# Patient Record
Sex: Male | Born: 1950
Health system: Southern US, Community
[De-identification: ages and names within clinical notes are randomized; demographics above are authoritative.]

## PROBLEM LIST (undated history)

## (undated) ENCOUNTER — Emergency Department (HOSPITAL_COMMUNITY): Admission: EM | Payer: Self-pay | Source: Home / Self Care

## (undated) DIAGNOSIS — K219 Gastro-esophageal reflux disease without esophagitis: Secondary | ICD-10-CM

## (undated) DIAGNOSIS — R7303 Prediabetes: Secondary | ICD-10-CM

## (undated) HISTORY — PX: NASAL RECONSTRUCTION WITH SEPTAL REPAIR: SHX5665

## (undated) HISTORY — PX: POLYPECTOMY: SHX149

## (undated) HISTORY — DX: Prediabetes: R73.03

## (undated) HISTORY — PX: ESOPHAGOGASTRODUODENOSCOPY ENDOSCOPY: SHX5814

## (undated) HISTORY — PX: COLONOSCOPY: SHX174

## (undated) HISTORY — DX: Gastro-esophageal reflux disease without esophagitis: K21.9

---

## 2008-11-02 ENCOUNTER — Encounter: Admission: RE | Admit: 2008-11-02 | Discharge: 2008-11-02 | Payer: Self-pay | Admitting: Specialist

## 2011-06-22 ENCOUNTER — Ambulatory Visit (HOSPITAL_BASED_OUTPATIENT_CLINIC_OR_DEPARTMENT_OTHER)
Admission: RE | Admit: 2011-06-22 | Discharge: 2011-06-22 | Disposition: A | Discharge status: Home / Self Care | Payer: Self-pay | Source: Ambulatory Visit | Attending: Internal Medicine | Admitting: Internal Medicine

## 2011-06-22 ENCOUNTER — Encounter: Payer: Self-pay | Admitting: Internal Medicine

## 2011-06-22 ENCOUNTER — Ambulatory Visit (INDEPENDENT_AMBULATORY_CARE_PROVIDER_SITE_OTHER): Payer: Self-pay | Admitting: Internal Medicine

## 2011-06-22 VITALS — BP 110/82 | HR 80 | Temp 97.9°F | Resp 18 | Ht 68.5 in | Wt 163.0 lb

## 2011-06-22 DIAGNOSIS — R0789 Other chest pain: Secondary | ICD-10-CM | POA: Insufficient documentation

## 2011-06-22 DIAGNOSIS — R079 Chest pain, unspecified: Secondary | ICD-10-CM | POA: Insufficient documentation

## 2011-06-22 DIAGNOSIS — F17200 Nicotine dependence, unspecified, uncomplicated: Secondary | ICD-10-CM | POA: Insufficient documentation

## 2011-06-22 NOTE — Assessment & Plan Note (Signed)
Obtain cxr pa/lat. Pain resolved. Schedule f/u in 6wks or sooner if needed. Obtain record release from ED as pt believes labs also done. At time of f/u discuss preventive care measures as well.
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
## 2011-07-31 ENCOUNTER — Telehealth: Payer: Self-pay | Admitting: *Deleted

## 2011-07-31 ENCOUNTER — Ambulatory Visit: Payer: Self-pay | Admitting: Internal Medicine

## 2011-07-31 NOTE — Telephone Encounter (Signed)
Marj, Please call pt to reschedule appt.    Office Message [address] [place], [place] [address] p. [phone] f. [phone] To: Lorrin Mais Fax: [phone] From: [facility] Date/ Time: 07/28/2011 5:46 PM Taken By: Delrae Rend, CSR Caller: Naeem Facility: not collected Patient: Marcus Massey, Marcus Massey DOB: [birth year] Phone: [phone] Reason for Call: See info below Regarding Appointment: Yes Appt Date: 07/31/2011 Appt Time: 1:45:00 PM Provider: Marguarite Arbour Reason: Details: Unable to make appointment time, will call back to reschdule. Outcome: Instructed patient to call back on the next business day.

## 2015-01-24 ENCOUNTER — Emergency Department (HOSPITAL_COMMUNITY)
Admission: EM | Admit: 2015-01-24 | Discharge: 2015-01-24 | Disposition: A | Discharge status: Home / Self Care | Payer: Self-pay | Attending: Emergency Medicine | Admitting: Emergency Medicine

## 2015-01-24 ENCOUNTER — Emergency Department (HOSPITAL_COMMUNITY): Payer: Self-pay

## 2015-01-24 ENCOUNTER — Encounter (HOSPITAL_COMMUNITY): Payer: Self-pay | Admitting: Emergency Medicine

## 2015-01-24 DIAGNOSIS — R0789 Other chest pain: Secondary | ICD-10-CM | POA: Insufficient documentation

## 2015-01-24 DIAGNOSIS — Z720 Tobacco use: Secondary | ICD-10-CM | POA: Insufficient documentation

## 2015-01-24 LAB — BASIC METABOLIC PANEL
ANION GAP: 10 (ref 5–15)
BUN: 14 mg/dL (ref 6–20)
CHLORIDE: 102 mmol/L (ref 101–111)
CO2: 25 mmol/L (ref 22–32)
CREATININE: 1.07 mg/dL (ref 0.61–1.24)
Calcium: 9.1 mg/dL (ref 8.9–10.3)
Glucose, Bld: 217 mg/dL — ABNORMAL HIGH (ref 65–99)
POTASSIUM: 3.4 mmol/L — AB (ref 3.5–5.1)
SODIUM: 137 mmol/L (ref 135–145)

## 2015-01-24 LAB — CBC
HCT: 46 % (ref 39.0–52.0)
Hemoglobin: 15.8 g/dL (ref 13.0–17.0)
MCH: 31.7 pg (ref 26.0–34.0)
MCHC: 34.3 g/dL (ref 30.0–36.0)
MCV: 92.4 fL (ref 78.0–100.0)
Platelets: 221 10*3/uL (ref 150–400)
RBC: 4.98 MIL/uL (ref 4.22–5.81)
RDW: 13.1 % (ref 11.5–15.5)
WBC: 7.9 10*3/uL (ref 4.0–10.5)

## 2015-01-24 LAB — I-STAT TROPONIN, ED: Troponin i, poc: 0.03 ng/mL (ref 0.00–0.08)

## 2015-01-24 MED ORDER — PREDNISONE 20 MG PO TABS
60.0000 mg | ORAL_TABLET | ORAL | Status: AC
  Administered 2015-01-24: 60 mg via ORAL
  Filled 2015-01-24: qty 3

## 2015-01-24 MED ORDER — PREDNISONE 20 MG PO TABS: 60.0000 mg | ORAL_TABLET | Freq: Every day | ORAL | Status: AC

## 2015-01-24 MED ORDER — SUCRALFATE 1 GM/10ML PO SUSP
1.0000 g | Freq: Three times a day (TID) | ORAL | Status: DC
  Administered 2015-01-24: 1 g via ORAL
  Filled 2015-01-24 (×2): qty 10

## 2015-01-24 MED ORDER — SUCRALFATE 1 GM/10ML PO SUSP: 1.0000 g | Freq: Three times a day (TID) | ORAL | Status: DC

## 2015-01-24 MED ORDER — KETOROLAC TROMETHAMINE 30 MG/ML IJ SOLN
30.0000 mg | Freq: Once | INTRAMUSCULAR | Status: AC
  Administered 2015-01-24: 30 mg via INTRAVENOUS
  Filled 2015-01-24: qty 1
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
## 2015-01-24 NOTE — ED Notes (Signed)
Pt. reports left chest pain onset last night with SOB , no cough or congestion , denies nausea or diaphoresis .

## 2015-01-24 NOTE — Discharge Instructions (Signed)
As discussed, today's evaluation has been largely reassuring. Your chest pain is likely due to inflammatory changes in the chest wall. However, with your history of smoking, it is important that you follow-up with our physicians for additional evaluation.  Return here for concerning changes in your condition.

## 2015-01-26 ENCOUNTER — Telehealth: Payer: Self-pay | Admitting: Internal Medicine

## 2015-01-26 NOTE — Telephone Encounter (Signed)
Experiencing bloating, indigestion, and stomach pain and discomfort and some days ago he has had shortness of breath. Patient is wondering if he needs to return to the ER or if he can obtain a sooner appt. Dr Jarold Song does not have anything sooner that the 29th. Please follow up with pt.

## 2015-02-03 ENCOUNTER — Ambulatory Visit: Payer: Self-pay | Attending: Family Medicine

## 2015-02-08 ENCOUNTER — Ambulatory Visit: Payer: Self-pay | Attending: Family Medicine | Admitting: Family Medicine

## 2015-02-08 ENCOUNTER — Encounter: Payer: Self-pay | Admitting: *Deleted

## 2015-02-08 ENCOUNTER — Encounter: Payer: Self-pay | Admitting: Family Medicine

## 2015-02-08 VITALS — BP 99/71 | HR 73 | Temp 98.2°F | Ht 68.5 in | Wt 162.0 lb

## 2015-02-08 DIAGNOSIS — R739 Hyperglycemia, unspecified: Secondary | ICD-10-CM

## 2015-02-08 DIAGNOSIS — R079 Chest pain, unspecified: Secondary | ICD-10-CM | POA: Insufficient documentation

## 2015-02-08 DIAGNOSIS — Z79899 Other long term (current) drug therapy: Secondary | ICD-10-CM | POA: Insufficient documentation

## 2015-02-08 DIAGNOSIS — R7309 Other abnormal glucose: Secondary | ICD-10-CM | POA: Insufficient documentation

## 2015-02-08 DIAGNOSIS — E876 Hypokalemia: Secondary | ICD-10-CM | POA: Insufficient documentation

## 2015-02-08 DIAGNOSIS — K2990 Gastroduodenitis, unspecified, without bleeding: Secondary | ICD-10-CM

## 2015-02-08 DIAGNOSIS — R1013 Epigastric pain: Secondary | ICD-10-CM | POA: Insufficient documentation

## 2015-02-08 DIAGNOSIS — R7303 Prediabetes: Secondary | ICD-10-CM | POA: Insufficient documentation

## 2015-02-08 DIAGNOSIS — K2970 Gastritis, unspecified, without bleeding: Secondary | ICD-10-CM | POA: Insufficient documentation

## 2015-02-08 LAB — POCT GLYCOSYLATED HEMOGLOBIN (HGB A1C): HEMOGLOBIN A1C: 6.3

## 2015-02-08 LAB — BASIC METABOLIC PANEL
BUN: 22 mg/dL (ref 7–25)
CO2: 25 mmol/L (ref 20–31)
Calcium: 8.8 mg/dL (ref 8.6–10.3)
Chloride: 102 mmol/L (ref 98–110)
Creat: 0.88 mg/dL (ref 0.70–1.25)
GLUCOSE: 101 mg/dL — AB (ref 65–99)
POTASSIUM: 4.5 mmol/L (ref 3.5–5.3)
SODIUM: 141 mmol/L (ref 135–146)

## 2015-02-08 MED ORDER — SUCRALFATE 1 G PO TABS
1.0000 g | ORAL_TABLET | Freq: Three times a day (TID) | ORAL | Status: DC
Start: 2015-02-08 — End: 2015-04-21

## 2015-02-08 MED ORDER — PANTOPRAZOLE SODIUM 40 MG PO TBEC: 40.0000 mg | DELAYED_RELEASE_TABLET | Freq: Every day | ORAL | Status: DC

## 2015-02-08 MED ORDER — SUCRALFATE 1 GM/10ML PO SUSP: 1.0000 g | Freq: Three times a day (TID) | ORAL | Status: DC

## 2015-02-08 NOTE — Progress Notes (Signed)
Patient reports episodes of severe heartburn that have been happening more frequently He was given carafate in the ED and has finished that He states he notices it after he eats or even several hours after he eats

## 2015-02-08 NOTE — Telephone Encounter (Signed)
Error

## 2015-02-08 NOTE — Progress Notes (Signed)
Marcus Massey, is a 64 y.o. male  YKZ:[id number]  LTJ:[id number]  DOB - January 30, 1951  CC:  Chief Complaint  Patient presents with  . Follow-up       HPI: Marcus Massey is a 64 y.o. male was seen at the [facility] ED on 01/24/15 after he had presented with chest pain. He was ruled out for ACS with negative troponins, EKG revealed normal sinus rhythm, he had a CXR which was negative for acute cardiopulmonary process but revealed emphysema. He had labs drawn which were unremarkable except for mild hypokalemia of 3.4 and elevated glucose of 217. He was prescribed Carafate which he took with improvement in symptoms until he ran out.  Complains of epigastric pain usually after a meal which is 8/10 in intensity; denies late meals. Denies diarrhea, constipation, melena.  Patient has No headache, No chest pain- No Nausea, No new weakness tingling or numbness, No Cough - SOB.  No Known Allergies History reviewed. No pertinent past medical history. Current Outpatient Prescriptions on File Prior to Visit  Medication Sig Dispense Refill  . sucralfate (CARAFATE) 1 GM/10ML suspension Take 10 mLs (1 g total) by mouth 4 (four) times daily -  with meals and at bedtime. (Patient not taking: Reported on 02/08/2015) 420 mL 0   No current facility-administered medications on file prior to visit.   History reviewed. No pertinent family history. Social History   Social History  . Marital Status: Married    Spouse Name: N/A  . Number of Children: N/A  . Years of Education: N/A   Occupational History  . Not on file.   Social History Main Topics  . Smoking status: Former Smoker -- 0.00 packs/day    Quit date: 01/25/2015  . Smokeless tobacco: Never Used  . Alcohol Use: No  . Drug Use: No  . Sexual Activity: Not on file   Other Topics Concern  . Not on file   Social History Narrative    Review of Systems: Constitutional: Negative for fever, chills, diaphoresis, activity change, appetite change and  fatigue. HENT: Negative for ear pain, nosebleeds, congestion, facial swelling, rhinorrhea, neck pain, neck stiffness and ear discharge.  Eyes: Negative for pain, discharge, redness, itching and visual disturbance. Respiratory: Negative for cough, choking, chest tightness, shortness of breath, wheezing and stridor.  Cardiovascular: Negative for chest pain, palpitations and leg swelling. Gastrointestinal: Negative for abdominal distention, positive for abdominal pain. Genitourinary: Negative for dysuria, urgency, frequency, hematuria, flank pain, decreased urine volume, difficulty urinating and dyspareunia.  Musculoskeletal: Negative for back pain, joint swelling, arthralgia and gait problem. Neurological: Negative for dizziness, tremors, seizures, syncope, facial asymmetry, speech difficulty, weakness, light-headedness, numbness and headaches.  Hematological: Negative for adenopathy. Does not bruise/bleed easily. Skin: Negative for rash, ulcer. Psychiatric/Behavioral: Negative for hallucinations, behavioral problems, confusion, dysphoric mood, decreased concentration and agitation.    Objective:   Filed Vitals:   02/08/15 1513  BP: 99/71  Pulse: 73  Temp: 98.2 F (36.8 C)    Physical Exam: Constitutional: Patient appears well-developed and well-nourished. No distress. HENT: Normocephalic, atraumatic, External right and left ear normal. Oropharynx is clear and moist.  Eyes: Conjunctivae and EOM are normal. PERRLA, no scleral icterus. Neck: Normal ROM, No JVD. No tracheal deviation. No thyromegaly. CVS: RRR, S1/S2 +, no murmurs, no gallops, no carotid bruit.  Pulmonary: Effort and breath sounds normal, no stridor, rhonchi, wheezes, rales.  Abdominal: Soft. BS +, no distension, + epigastric tenderness, no rebound or guarding.  Musculoskeletal: Normal range of motion.  No edema and no tenderness.  Lymphadenopathy: No lymphadenopathy noted, cervical, inguinal or axillary Neuro: Alert.  Normal reflexes, muscle tone coordination. No cranial nerve deficit. Skin: Skin is warm and dry. No rash noted. Not diaphoretic. No erythema. No pallor. Psychiatric: Normal mood and affect. Behavior, judgment, thought content normal.   Lab Results  Component Value Date   WBC 7.9 01/24/2015   HGB 15.8 01/24/2015   HCT 46.0 01/24/2015   MCV 92.4 01/24/2015   PLT 221 01/24/2015   Lab Results  Component Value Date   CREATININE 1.07 01/24/2015   BUN 14 01/24/2015   NA 137 01/24/2015   K 3.4* 01/24/2015   CL 102 01/24/2015   CO2 25 01/24/2015    Lab Results  Component Value Date   HGBA1C 6.30 02/08/2015       Assessment and plan:  Gastritis: Placed on Protonix and Carafate has been refilled. Will reassess for improvement at next visit and determine need for EGD.  Pre DM: Hba1c of 6.3 Will need to be educated on ADA diet and lifestyle modification.  Hypokalemia: Had a potassium of 3.4 Repeat today.   The patient was given clear instructions to go to ER or return to medical center if symptoms don't improve, worsen or new problems develop. The patient verbalized understanding. The patient was told to call to get lab results if they haven't heard anything in the next week.     Marcus Massey, [facility] [phone] 02/08/2015, 3:36 PM

## 2015-02-08 NOTE — Patient Instructions (Signed)

## 2015-02-09 ENCOUNTER — Telehealth: Payer: Self-pay | Admitting: *Deleted

## 2015-02-09 NOTE — Telephone Encounter (Signed)
Verified name and date of birth and  informed the patient that labs are normal. Thank you.

## 2015-02-09 NOTE — Telephone Encounter (Signed)
-----   Message from Arnoldo Morale, MD sent at 02/09/2015  8:13 AM EDT ----- Please inform the patient that labs are normal. Thank you.

## 2015-03-10 ENCOUNTER — Encounter: Payer: Self-pay | Admitting: *Deleted

## 2015-03-10 ENCOUNTER — Ambulatory Visit: Payer: Self-pay | Attending: Family Medicine | Admitting: Family Medicine

## 2015-03-10 ENCOUNTER — Encounter: Payer: Self-pay | Admitting: Family Medicine

## 2015-03-10 VITALS — BP 111/81 | HR 67 | Temp 98.1°F | Ht 68.5 in | Wt 159.6 lb

## 2015-03-10 DIAGNOSIS — K2970 Gastritis, unspecified, without bleeding: Secondary | ICD-10-CM | POA: Insufficient documentation

## 2015-03-10 DIAGNOSIS — K219 Gastro-esophageal reflux disease without esophagitis: Secondary | ICD-10-CM | POA: Insufficient documentation

## 2015-03-10 DIAGNOSIS — R7303 Prediabetes: Secondary | ICD-10-CM

## 2015-03-10 DIAGNOSIS — R7309 Other abnormal glucose: Secondary | ICD-10-CM | POA: Insufficient documentation

## 2015-03-10 DIAGNOSIS — K2990 Gastroduodenitis, unspecified, without bleeding: Secondary | ICD-10-CM

## 2015-03-10 NOTE — Telephone Encounter (Signed)
Error

## 2015-03-10 NOTE — Progress Notes (Signed)
Patient reports no pain today and that his indigestion is better now that he is taking his medications He states when he overeats he still experiences indigestion He is not wanting the flu shot because he is afraid of the cost of it and that he can get it for free at his church.

## 2015-03-10 NOTE — Progress Notes (Signed)
CC:  HPI: Marcus Massey is a 64 y.o. male with a history of prediabetes (A1c 6.3), gastritis and GERD currently on Protonix and Carafate and reports improvement in symptoms since he started the Protonix. However he complains of having to eat half the amount he usually ate due to early satiety; whenever he eats beef he states he feels the food in his stomach for up to 2 days and describes it as "half of my stomach is working". He denies weight loss, diarrhea, constipation or melena.      No Known Allergies History reviewed. No pertinent past medical history. Current Outpatient Prescriptions on File Prior to Visit  Medication Sig Dispense Refill  . pantoprazole (PROTONIX) 40 MG tablet Take 1 tablet (40 mg total) by mouth daily. 30 tablet 3  . sucralfate (CARAFATE) 1 G tablet Take 1 tablet (1 g total) by mouth 4 (four) times daily -  with meals and at bedtime. 120 tablet 1   No current facility-administered medications on file prior to visit.   History reviewed. No pertinent family history. Social History   Social History  . Marital Status: Married    Spouse Name: N/A  . Number of Children: N/A  . Years of Education: N/A   Occupational History  . Not on file.   Social History Main Topics  . Smoking status: Former Smoker -- 0.00 packs/day    Quit date: 01/25/2015  . Smokeless tobacco: Never Used  . Alcohol Use: No  . Drug Use: No  . Sexual Activity: Not on file   Other Topics Concern  . Not on file   Social History Narrative    Review of Systems: Constitutional: Negative for fever, chills, diaphoresis, activity change, appetite change and fatigue. HENT: Negative for ear pain, nosebleeds, congestion, facial swelling, rhinorrhea, neck pain, neck stiffness and ear discharge.  Eyes: Negative for pain, discharge, redness, itching and visual disturbance. Respiratory: Negative for cough, choking, chest tightness, shortness of breath, wheezing and stridor.  Cardiovascular:  Negative for chest pain, palpitations and leg swelling. Gastrointestinal: Negative for abdominal distention. Genitourinary: Negative for dysuria, urgency, frequency, hematuria, flank pain, decreased urine volume, difficulty urinating and dyspareunia.  Musculoskeletal: Negative for back pain, joint swelling, arthralgias and gait problem. Neurological: Negative for dizziness, tremors, seizures, syncope, facial asymmetry, speech difficulty, weakness, light-headedness, numbness and headaches.  Hematological: Negative for adenopathy. Does not bruise/bleed easily. Psychiatric/Behavioral: Negative for hallucinations, behavioral problems, confusion, dysphoric mood, decreased concentration and agitation.    Objective:   Filed Vitals:   03/10/15 1422  BP: 111/81  Pulse: 67  Temp: 98.1 F (36.7 C)    Physical Exam: Constitutional: Patient appears well-developed and well-nourished. No distress. HENT: Normocephalic, atraumatic, External right and left ear normal. Oropharynx is clear and moist.  Eyes: Conjunctivae and EOM are normal. PERRLA, no scleral icterus. Neck: Normal ROM. Neck supple. No JVD. No tracheal deviation. No thyromegaly. CVS: RRR, S1/S2 +, no murmurs, no gallops, no carotid bruit.  Pulmonary: Effort and breath sounds normal, no stridor, rhonchi, wheezes, rales.  Abdominal: Soft. BS +,  no distension, tenderness, rebound or guarding.  Musculoskeletal: Normal range of motion. No edema and no tenderness.  Lymphadenopathy: No lymphadenopathy noted, cervical, inguinal or axillary Neuro: Alert. Normal reflexes, muscle tone coordination. No cranial nerve deficit. Skin: Skin is warm and dry. No rash noted. Not diaphoretic. No erythema. No pallor. Psychiatric: Normal mood and affect. Behavior, judgment, thought content normal.  Lab Results  Component Value Date   WBC 7.9 01/24/2015  HGB 15.8 01/24/2015   HCT 46.0 01/24/2015   MCV 92.4 01/24/2015   PLT 221 01/24/2015   Lab Results    Component Value Date   CREATININE 0.88 02/08/2015   BUN 22 02/08/2015   NA 141 02/08/2015   K 4.5 02/08/2015   CL 102 02/08/2015   CO2 25 02/08/2015    Lab Results  Component Value Date   HGBA1C 6.30 02/08/2015        Assessment and plan:  Gastritis and GERD Continue Protonix and Carafate has been refilled. He still complains of early satiety and indigestion and so he will need an EGD. Advised to apply for visit the [facility] discount and Orange card to facilitate referral to GI  Pre DM: Hba1c of 6.3 Educated on ADA diet and lifestyle modification.   Arnoldo Morale, [facility] [phone] 03/10/2015, 2:40 PM

## 2015-03-10 NOTE — Patient Instructions (Signed)
Food Choices for Gastroesophageal Reflux Disease When you have gastroesophageal reflux disease (GERD), the foods you eat and your eating habits are very important. Choosing the right foods can help ease the discomfort of GERD. WHAT GENERAL GUIDELINES DO I NEED TO FOLLOW?  Choose fruits, vegetables, whole grains, low-fat dairy products, and low-fat meat, fish, and poultry.  Limit fats such as oils, salad dressings, butter, nuts, and avocado.  Keep a food diary to identify foods that cause symptoms.  Avoid foods that cause reflux. These may be different for different people.  Eat frequent small meals instead of three large meals each day.  Eat your meals slowly, in a relaxed setting.  Limit fried foods.  Cook foods using methods other than frying.  Avoid drinking alcohol.  Avoid drinking large amounts of liquids with your meals.  Avoid bending over or lying down until 2-3 hours after eating. WHAT FOODS ARE NOT RECOMMENDED? The following are some foods and drinks that may worsen your symptoms: Vegetables Tomatoes. Tomato juice. Tomato and spaghetti sauce. Chili peppers. Onion and garlic. Horseradish. Fruits Oranges, grapefruit, and lemon (fruit and juice). Meats High-fat meats, fish, and poultry. This includes hot dogs, ribs, ham, sausage, salami, and bacon. Dairy Whole milk and chocolate milk. Sour cream. Cream. Butter. Ice cream. Cream cheese.  Beverages Coffee and tea, with or without caffeine. Carbonated beverages or energy drinks. Condiments Hot sauce. Barbecue sauce.  Sweets/Desserts Chocolate and cocoa. Donuts. Peppermint and spearmint. Fats and Oils High-fat foods, including French fries and potato chips. Other Vinegar. Strong spices, such as black pepper, white pepper, red pepper, cayenne, curry powder, cloves, ginger, and chili powder. The items listed above may not be a complete list of foods and beverages to avoid. Contact your dietitian for more  information. Document Released: 05/29/2005 Document Revised: 06/03/2013 Document Reviewed: 04/02/2013 Exit[employer] Patient Information 2015 [employer]. This information is not intended to replace advice given to you by your health care Daylen Hack. Make sure you discuss any questions you have with your health care Janayla Marik.  

## 2015-03-23 ENCOUNTER — Telehealth: Payer: Self-pay | Admitting: Family Medicine

## 2015-04-07 ENCOUNTER — Telehealth: Payer: Self-pay | Admitting: General Practice

## 2015-04-07 NOTE — Telephone Encounter (Signed)
Patient presents to front office to inform Nurse that he received his orange card and would like for referral for GI to be placed.  Please assist pt with referral

## 2015-04-15 NOTE — Telephone Encounter (Signed)
Pt. Came in to put his OC in file. Please f/u with pt.

## 2015-04-15 NOTE — Telephone Encounter (Signed)
CMA called pt, pt wife answered and verified name and DOB. I told pt's wife that i would have to send a message to Dr. Jarold Song so she could put in a referral for the GI. And, to also have her husband to stop by so that we could scan orange card into pt file.

## 2015-04-16 ENCOUNTER — Other Ambulatory Visit: Payer: Self-pay | Admitting: Family Medicine

## 2015-04-16 DIAGNOSIS — R6881 Early satiety: Secondary | ICD-10-CM

## 2015-04-16 DIAGNOSIS — K2990 Gastroduodenitis, unspecified, without bleeding: Principal | ICD-10-CM

## 2015-04-16 DIAGNOSIS — K2970 Gastritis, unspecified, without bleeding: Secondary | ICD-10-CM

## 2015-04-19 ENCOUNTER — Telehealth: Payer: Self-pay

## 2015-04-19 ENCOUNTER — Ambulatory Visit: Payer: Self-pay | Attending: Family Medicine

## 2015-04-19 NOTE — Telephone Encounter (Signed)
CMA, called pt, pt verified name and DOB. Pt was actually with Diane during our call. Pt was given the information that [facility] will be calling to set the appt up.

## 2015-04-21 ENCOUNTER — Telehealth: Payer: Self-pay | Admitting: Internal Medicine

## 2015-04-21 ENCOUNTER — Ambulatory Visit (INDEPENDENT_AMBULATORY_CARE_PROVIDER_SITE_OTHER): Payer: Self-pay | Admitting: Internal Medicine

## 2015-04-21 ENCOUNTER — Encounter: Payer: Self-pay | Admitting: Internal Medicine

## 2015-04-21 VITALS — BP 130/62 | HR 72 | Ht 68.7 in | Wt 170.2 lb

## 2015-04-21 DIAGNOSIS — R1013 Epigastric pain: Secondary | ICD-10-CM

## 2015-04-21 DIAGNOSIS — R140 Abdominal distension (gaseous): Secondary | ICD-10-CM

## 2015-04-21 DIAGNOSIS — Z1211 Encounter for screening for malignant neoplasm of colon: Secondary | ICD-10-CM

## 2015-04-21 DIAGNOSIS — G8929 Other chronic pain: Secondary | ICD-10-CM

## 2015-04-21 DIAGNOSIS — K219 Gastro-esophageal reflux disease without esophagitis: Secondary | ICD-10-CM

## 2015-04-21 NOTE — Progress Notes (Signed)
HISTORY OF PRESENT ILLNESS:  Marcus Massey is a 64 y.o. male , [place] native, who is sent today by Dr. Jarold Song regarding recurrent problems with epigastric pain and abdominal fullness. The patient reports a 3 year history of infrequent episodes of benign epigastric discomfort. In recent months the symptoms became more frequent and persistent. He came to medical attention and was prescribed a course of pantoprazole and Carafate. He took this for one month with resolution of symptoms. He discontinued the medications 2 days ago. She does have a history of occasional heartburn. Patient also reports a heaviness or bloating fullness sensation exacerbated by meals. He feels like his food "does not digest". He has had no change in his weight. Bowel habits are regular without bleeding. He has not had prior GI evaluations including screening colonoscopy.  REVIEW OF SYSTEMS:  All non-GI ROS negative upon extensive review of all systems  Past Medical History  Diagnosis Date  . Prediabetes   . GERD (gastroesophageal reflux disease)     Past Surgical History  Procedure Laterality Date  . Nasal reconstruction with septal repair      Social History Marcus Massey  reports that he quit smoking about 2 months ago. He has never used smokeless tobacco. He reports that he does not drink alcohol or use illicit drugs.  family history includes Diabetes in his brother; Liver disease in his father.  No Known Allergies     PHYSICAL EXAMINATION: Vital signs: BP 130/62 mmHg  Pulse 72  Ht 5' 8.7" (1.745 m)  Wt 170 lb 4 oz (77.225 kg)  BMI 25.36 kg/m2  Constitutional: generally well-appearing, no acute distress Psychiatric: alert and oriented x3, cooperative Eyes: extraocular movements intact, anicteric, conjunctiva pink Mouth: oral pharynx moist, no lesions Neck: supple no lymphadenopathy Cardiovascular: heart regular rate and rhythm, no murmur Lungs: clear to auscultation bilaterally Abdomen: soft,  nontender, nondistended, no obvious ascites, no peritoneal signs, normal bowel sounds, no organomegaly Rectal: Deferred until colonoscopy Extremities: no clubbing cyanosis or lower extremity edema bilaterally Skin: no lesions on visible extremities Neuro: No focal deficits. No asterixis.   ASSESSMENT:  #1. Recurrent problems with epigastric pain. Possibly peptic ulcer disease responding to PPI and Carafate. Could be GERD #2. Intermittent GERD symptoms #3. Abdominal bloating and fullness postprandially. No alarm features. Relatively new #4. Colon cancer screening. Due  PLAN:  #1. Recommend upper endoscopy with biopsies to exclude Helicobacter pylori, evaluate epigastric pain, and abdominal bloating fullness discomfort.The nature of the procedure, as well as the risks, benefits, and alternatives were carefully and thoroughly reviewed with the patient. Ample time for discussion and questions allowed. The patient understood, was satisfied, and agreed to proceed. #2. Recommend colonoscopy to provide colon cancer screening and evaluate bloating fullness discomfortThe nature of the procedure, as well as the risks, benefits, and alternatives were carefully and thoroughly reviewed with the patient. Ample time for discussion and questions allowed. The patient understood, was satisfied, and agreed to proceed.   Patient wishes to investigate the cost of procedures though he is interested. He has been referred to our business personnel. He has been given brochures. As well, our number to contact the office to set up procedures if he chooses to proceed. I did discuss potential findings on endoscopy and colonoscopy, such that he is fully informed.   A copy of this dictation has been sent to Dr. Jarold Song

## 2015-04-21 NOTE — Patient Instructions (Signed)
Please call [phone] to find out exactly what the payment process would be for an endoscopy and colonoscopy.  If you decide you would like to proceed with this procedure call the office to schedule the procedure and a nurse visit

## 2015-04-21 NOTE — Telephone Encounter (Signed)
Pt will need to contact [facility]  PH# [phone] #[phone]

## 2015-04-21 NOTE — Telephone Encounter (Signed)
Patient would like to know the payment process for his endoscopy colonoscopy referral. Please follow up with patient. Thank you.

## 2015-05-05 ENCOUNTER — Encounter: Payer: Self-pay | Admitting: Family Medicine

## 2015-05-05 NOTE — Telephone Encounter (Addendum)
Error

## 2015-05-10 NOTE — Telephone Encounter (Signed)
CMA called patient, patient was not available to take my call. I left a message for the patient to call me upon receiving the VM.   Message for patient once they call back:   Just verifying that we do have his OC on file.

## 2015-05-26 ENCOUNTER — Ambulatory Visit: Payer: Self-pay | Attending: Family Medicine

## 2015-10-23 LAB — GLUCOSE, POCT (MANUAL RESULT ENTRY): POC Glucose: 103 mg/dl — AB (ref 70–99)

## 2015-10-23 LAB — POCT LIPID PANEL
HDL: 44
LDL: 178
TC: 262
TRG: 202

## 2016-01-18 ENCOUNTER — Telehealth: Payer: Self-pay | Admitting: *Deleted

## 2016-01-19 ENCOUNTER — Telehealth: Payer: Self-pay | Admitting: Internal Medicine

## 2016-01-19 NOTE — Telephone Encounter (Signed)
Dr. Henrene Pastor please advise if this pt can be scheduled for a direct ECL or does the pt needs to be seen for an OV since he has not been seen since 04/2015.

## 2016-01-24 NOTE — Telephone Encounter (Signed)
If there are no new issues, okay to schedule procedures directly. If there are new issues, needs office reevaluation.

## 2016-01-26 NOTE — Telephone Encounter (Signed)
Wife states pt is not able to eat much, reports only eats one meal a day. At last OV Dr. Henrene Pastor mentioned scheduled pt for an ECL. They are interested in scheduling these procedures. Pt scheduled to see Alonza Bogus PA 01/28/16@2 :30pm.

## 2016-01-28 ENCOUNTER — Ambulatory Visit (INDEPENDENT_AMBULATORY_CARE_PROVIDER_SITE_OTHER): Payer: BLUE CROSS/BLUE SHIELD | Admitting: Gastroenterology

## 2016-01-28 ENCOUNTER — Encounter: Payer: Self-pay | Admitting: Gastroenterology

## 2016-01-28 VITALS — BP 116/72 | Ht 68.5 in | Wt 157.0 lb

## 2016-01-28 DIAGNOSIS — R198 Other specified symptoms and signs involving the digestive system and abdomen: Secondary | ICD-10-CM | POA: Insufficient documentation

## 2016-01-28 DIAGNOSIS — R634 Abnormal weight loss: Secondary | ICD-10-CM

## 2016-01-28 DIAGNOSIS — R140 Abdominal distension (gaseous): Secondary | ICD-10-CM | POA: Insufficient documentation

## 2016-01-28 MED ORDER — NA SULFATE-K SULFATE-MG SULF 17.5-3.13-1.6 GM/177ML PO SOLN: 1.0000 | Freq: Once | ORAL | 0 refills | Status: AC

## 2016-01-28 MED FILL — SUPREP BOWEL PREP KIT: 17.5-3.13-1 | 1 days supply | Qty: 354 | Fill #0

## 2016-01-28 NOTE — Patient Instructions (Addendum)
You have been scheduled for an endoscopy and colonoscopy. Please follow the written instructions given to you at your visit today. Please pick up your prep supplies at the pharmacy within the next 1-3 days. If you use inhalers (even only as needed), please bring them with you on the day of your procedure.  

## 2016-01-28 NOTE — Progress Notes (Signed)
     01/28/2016 Marcus Massey [id number] Mar 10, 1951   History of Present Illness:  This is a 65 year old male who is known to Dr. Henrene Pastor for an office visit in November 2016. At that time he was scheduled for an EGD and colonoscopy for screening purposes and to evaluate atypical chest pain/reflux. Patient tells me that he did not have insurance at that time said he did not proceed with the studies. He says that the chest pain and reflux issues have resolved, but now he is having other issues. He complains that he "can't digest food properly". Tells me that he is only eating 1 meal a day but he is able to eat his entire full meal without issues but then he does not get hungry and cannot eat again for several hours. Says that he feels very bloated and full. He does NOT complain of early satiety where he gets full very quickly after just a small amount. He has lost 13 pounds since his visit here in November 2016. He denies any nausea, vomiting, constipation, diarrhea, abdominal pain, rectal bleeding. He does not have any similar complaints to what he had previously and denies any reflux.   Current Medications, Allergies, Past Medical History, Past Surgical History, Family History and Social History were reviewed in [employer] record.   Physical Exam: BP 116/72   Ht 5' 8.5" (1.74 m)   Wt 157 lb (71.2 kg)   BMI 23.52 kg/m  General: Well developed Asian male in no acute distress Head: Normocephalic and atraumatic Eyes:  Sclerae anicteric, conjunctiva pink  Ears: Normal auditory acuity Lungs: Clear throughout to auscultation Heart: Regular rate and rhythm Abdomen: Soft, non-distended.  Normal bowel sounds.  Non-tender. Rectal:  Will be done at the time of colonoscopy. Musculoskeletal: Symmetrical with no gross deformities  Extremities: No edema  Neurological: Alert oriented x 4, grossly non-focal Psychological:  Alert and cooperative. Normal mood and affect  Assessment and  Recommendations: -Abdominal fullness/bloating and weight loss (13 pounds since 04/2015). Never proceeded with EGD and colonoscopy previously.  Likely needs to try to eat a few small meals throughout the day rather than one very large meal. -Screening colonoscopy  *Will schedule for EGD and colonoscopy with Dr. Henrene Pastor.  The risks, benefits, and alternatives to these procedures were discussed with the patient and he consents to proceed. *If these are negative then he likely will need CT scan abdomen and pelvis at least for evaluation of the weight loss.

## 2016-01-31 ENCOUNTER — Encounter: Payer: Self-pay | Admitting: Internal Medicine

## 2016-01-31 NOTE — Progress Notes (Signed)
Agree with initial assessment and plans for endoscopic evaluations and possible advanced imaging

## 2016-02-11 ENCOUNTER — Encounter: Payer: BLUE CROSS/BLUE SHIELD | Admitting: Internal Medicine

## 2016-02-22 ENCOUNTER — Telehealth: Payer: Self-pay | Admitting: *Deleted

## 2016-02-23 ENCOUNTER — Ambulatory Visit (AMBULATORY_SURGERY_CENTER): Payer: BLUE CROSS/BLUE SHIELD | Admitting: Internal Medicine

## 2016-02-23 ENCOUNTER — Encounter: Payer: Self-pay | Admitting: Internal Medicine

## 2016-02-23 VITALS — BP 105/65 | HR 66 | Temp 98.7°F | Resp 11 | Ht 68.0 in | Wt 157.0 lb

## 2016-02-23 DIAGNOSIS — R634 Abnormal weight loss: Secondary | ICD-10-CM

## 2016-02-23 DIAGNOSIS — R198 Other specified symptoms and signs involving the digestive system and abdomen: Secondary | ICD-10-CM | POA: Diagnosis not present

## 2016-02-23 DIAGNOSIS — Z1211 Encounter for screening for malignant neoplasm of colon: Secondary | ICD-10-CM | POA: Diagnosis present

## 2016-02-23 DIAGNOSIS — D124 Benign neoplasm of descending colon: Secondary | ICD-10-CM

## 2016-02-23 DIAGNOSIS — K219 Gastro-esophageal reflux disease without esophagitis: Secondary | ICD-10-CM | POA: Diagnosis not present

## 2016-02-23 DIAGNOSIS — D123 Benign neoplasm of transverse colon: Secondary | ICD-10-CM

## 2016-02-23 MED ORDER — SODIUM CHLORIDE 0.9 % IV SOLN: 500.0000 mL | INTRAVENOUS | Status: DC

## 2016-02-23 NOTE — Progress Notes (Signed)
To PACU Pt awake and alert. Report to RN 

## 2016-02-23 NOTE — Progress Notes (Signed)
Called to room to assist during endoscopic procedure.  Patient ID and intended procedure confirmed with present staff. Received instructions for my participation in the procedure from the performing physician.  

## 2016-02-23 NOTE — Op Note (Signed)
[place] Patient Name: Marcus Massey Procedure Date: 02/23/2016 2:35 PM MRN: [id number] Endoscopist: Docia Chuck. Henrene Pastor , MD Age: 65 Referring MD:  Date of Birth: [birth year] Gender: Male Account #: [id number] Procedure:                Colonoscopy, with cold snare polypectomy X3 Indications:              Screening for colorectal malignant neoplasm Medicines:                Monitored Anesthesia Care Procedure:                Pre-Anesthesia Assessment:                           - Prior to the procedure, a History and Physical                            was performed, and patient medications and                            allergies were reviewed. The patient's tolerance of                            previous anesthesia was also reviewed. The risks                            and benefits of the procedure and the sedation                            options and risks were discussed with the patient.                            All questions were answered, and informed consent                            was obtained. Prior Anticoagulants: The patient has                            taken no previous anticoagulant or antiplatelet                            agents. ASA Grade Assessment: II - A patient with                            mild systemic disease. After reviewing the risks                            and benefits, the patient was deemed in                            satisfactory condition to undergo the procedure.                           After obtaining informed consent, the colonoscope  was passed under direct vision. Throughout the                            procedure, the patient's blood pressure, pulse, and                            oxygen saturations were monitored continuously. The                            Model CF-HQ190L [id number]) scope was introduced                            through the anus and advanced to the the cecum,          identified by appendiceal orifice and ileocecal                            valve. The ileocecal valve, appendiceal orifice,                            and rectum were photographed. The quality of the                            bowel preparation was excellent. The colonoscopy                            was performed without difficulty. The patient                            tolerated the procedure well. The bowel preparation                            used was SUPREP. Scope In: 2:49:03 PM Scope Out: 3:04:23 PM Scope Withdrawal Time: 0 hours 11 minutes 15 seconds  Total Procedure Duration: 0 hours 15 minutes 20 seconds  Findings:                 Four polyps were found in the descending colon and                            transverse colon. The polyps were 2 to 5 mm in                            size. These polyps were removed with a cold snare.                            Resection and retrieval were complete.                           Internal hemorrhoids were found during retroflexion.                           The exam was otherwise without abnormality on  direct and retroflexion views. Complications:            No immediate complications. Estimated blood loss:                            None. Estimated Blood Loss:     Estimated blood loss: none. Impression:               - Four 2 to 5 mm polyps in the descending colon and                            in the transverse colon, removed with a cold snare.                            Resected and retrieved.                           - Internal hemorrhoids.                           - The examination was otherwise normal on direct                            and retroflexion views. Recommendation:           - Repeat colonoscopy in 3 years for surveillance.                           - EGD today. Please see report.                           - Await pathology results. Docia Chuck. Henrene Pastor, MD 02/23/2016 3:08:26 PM This  report has been signed electronically.

## 2016-02-23 NOTE — Op Note (Signed)
[place] Patient Name: Marcus Massey Procedure Date: 02/23/2016 2:34 PM MRN: [id number] Endoscopist: Docia Chuck. Henrene Pastor , MD Age: 65 Referring MD:  Date of Birth: 1951-03-29 Gender: Male Account #: [id number] Procedure:                Upper GI endoscopy Indications:              Abdominal distention, Weight loss Medicines:                Monitored Anesthesia Care Procedure:                Pre-Anesthesia Assessment:                           - Prior to the procedure, a History and Physical                            was performed, and patient medications and                            allergies were reviewed. The patient's tolerance of                            previous anesthesia was also reviewed. The risks                            and benefits of the procedure and the sedation                            options and risks were discussed with the patient.                            All questions were answered, and informed consent                            was obtained. Prior Anticoagulants: The patient has                            taken no previous anticoagulant or antiplatelet                            agents. ASA Grade Assessment: II - A patient with                            mild systemic disease. After reviewing the risks                            and benefits, the patient was deemed in                            satisfactory condition to undergo the procedure.                           After obtaining informed consent, the endoscope was  passed under direct vision. Throughout the                            procedure, the patient's blood pressure, pulse, and                            oxygen saturations were monitored continuously. The                            Model GIF-HQ190 [id number]) scope was introduced                            through the mouth, and advanced to the second part                            of duodenum. The upper GI  endoscopy was                            accomplished without difficulty. The patient                            tolerated the procedure well. Scope In: Scope Out: Findings:                 The esophagus was normal.                           The stomach was normal.                           The examined duodenum was normal.                           The cardia and gastric fundus were normal on                            retroflexion. Complications:            No immediate complications. Estimated Blood Loss:     Estimated blood loss: none. Impression:               - Normal esophagus.                           - Normal stomach.                           - Normal examined duodenum.                           - No specimens collected. Recommendation:           - Schedule contrast-enhanced CT scan of the abdomen                            and pelvis "weight loss and abdominal discomfort".                           -  Resume previous diet.                           - Office follow-up with Dr. Henrene Pastor in 6 weeks. Docia Chuck. Henrene Pastor, MD 02/23/2016 3:16:33 PM This report has been signed electronically.

## 2016-02-23 NOTE — Patient Instructions (Signed)
YOU HAD AN ENDOSCOPIC PROCEDURE TODAY AT [facility]:   Refer to the procedure report that was given to you for any specific questions about what was found during the examination.  If the procedure report does not answer your questions, please call your gastroenterologist to clarify.  If you requested that your care partner not be given the details of your procedure findings, then the procedure report has been included in a sealed envelope for you to review at your convenience later.  YOU SHOULD EXPECT: Some feelings of bloating in the abdomen. Passage of more gas than usual.  Walking can help get rid of the air that was put into your GI tract during the procedure and reduce the bloating. If you had a lower endoscopy (such as a colonoscopy or flexible sigmoidoscopy) you may notice spotting of blood in your stool or on the toilet paper. If you underwent a bowel prep for your procedure, you may not have a normal bowel movement for a few days.  Please Note:  You might notice some irritation and congestion in your nose or some drainage.  This is from the oxygen used during your procedure.  There is no need for concern and it should clear up in a day or so.  SYMPTOMS TO REPORT IMMEDIATELY:   Following lower endoscopy (colonoscopy or flexible sigmoidoscopy):  Excessive amounts of blood in the stool  Significant tenderness or worsening of abdominal pains  Swelling of the abdomen that is new, acute  Fever of 100F or higher   Following upper endoscopy (EGD)  Vomiting of blood or coffee ground material  New chest pain or pain under the shoulder blades  Painful or persistently difficult swallowing  New shortness of breath  Fever of 100F or higher  Black, tarry-looking stools  For urgent or emergent issues, a gastroenterologist can be reached at any hour by calling [phone].   DIET:  We do recommend a small meal at first, but then you may proceed to your regular diet.  Drink  plenty of fluids but you should avoid alcoholic beverages for 24 hours.  ACTIVITY:  You should plan to take it easy for the rest of today and you should NOT DRIVE or use heavy machinery until tomorrow (because of the sedation medicines used during the test).    FOLLOW UP: Our staff will call the number listed on your records the next business day following your procedure to check on you and address any questions or concerns that you may have regarding the information given to you following your procedure. If we do not reach you, we will leave a message.  However, if you are feeling well and you are not experiencing any problems, there is no need to return our call.  We will assume that you have returned to your regular daily activities without incident.  If any biopsies were taken you will be contacted by phone or by letter within the next 1-3 weeks.  Please call us at [phone] if you have not heard about the biopsies in 3 weeks.    SIGNATURES/CONFIDENTIALITY: You and/or your care partner have signed paperwork which will be entered into your electronic medical record.  These signatures attest to the fact that that the information above on your After Visit Summary has been reviewed and is understood.  Full responsibility of the confidentiality of this discharge information lies with you and/or your care-partner.  Polyps, hemorrhoids-handouts given  Repeat colonoscopy in 3 years 2020.  Normal EGD.

## 2016-02-24 ENCOUNTER — Telehealth: Payer: Self-pay

## 2016-02-24 ENCOUNTER — Other Ambulatory Visit: Payer: Self-pay

## 2016-02-24 DIAGNOSIS — R634 Abnormal weight loss: Secondary | ICD-10-CM

## 2016-02-24 DIAGNOSIS — R109 Unspecified abdominal pain: Secondary | ICD-10-CM

## 2016-02-24 NOTE — Telephone Encounter (Signed)
  Follow up Call-  Call back number 02/23/2016  Post procedure Call Back phone  # [phone]  Permission to leave phone message Yes  Some recent data might be hidden     Patient questions:  Do you have a fever, pain , or abdominal swelling? No. Pain Score  0 *  Have you tolerated food without any problems? Yes.    Have you been able to return to your normal activities? Yes.    Do you have any questions about your discharge instructions: Diet   No. Medications  No. Follow up visit  No.  Do you have questions or concerns about your Care? No.  Actions: * If pain score is 4 or above: No action needed, pain <4.

## 2016-02-24 NOTE — Telephone Encounter (Signed)
Pt scheduled for CT of A/P at [facility] CT 03/01/16@2 :30pm, pt to arrive there at 2:15pm. Pt to be NPO after 10:30am, drink bottle 1 of contrast at 12:30pm, bottle 2 at 1:30pm. Pt to come for labs prior to scan. Instructions and contrast left up front for pt to pick up. Pt scheduled for OV with Dr. Henrene Pastor 04/18/16@10am . Pts wife aware.

## 2016-02-28 ENCOUNTER — Other Ambulatory Visit (INDEPENDENT_AMBULATORY_CARE_PROVIDER_SITE_OTHER): Payer: BLUE CROSS/BLUE SHIELD

## 2016-02-28 DIAGNOSIS — R109 Unspecified abdominal pain: Secondary | ICD-10-CM | POA: Diagnosis not present

## 2016-02-28 LAB — BASIC METABOLIC PANEL
BUN: 19 mg/dL (ref 6–23)
CALCIUM: 8.9 mg/dL (ref 8.4–10.5)
CO2: 32 mEq/L (ref 19–32)
CREATININE: 0.93 mg/dL (ref 0.40–1.50)
Chloride: 105 mEq/L (ref 96–112)
GFR: 86.79 mL/min (ref >60.00)
GLUCOSE: 91 mg/dL (ref 70–99)
Potassium: 4 mEq/L (ref 3.5–5.1)
Sodium: 142 mEq/L (ref 135–145)

## 2016-02-29 ENCOUNTER — Encounter: Payer: Self-pay | Admitting: Internal Medicine

## 2016-03-01 ENCOUNTER — Ambulatory Visit (INDEPENDENT_AMBULATORY_CARE_PROVIDER_SITE_OTHER)
Admission: RE | Admit: 2016-03-01 | Discharge: 2016-03-01 | Disposition: A | Discharge status: Home / Self Care | Payer: BLUE CROSS/BLUE SHIELD | Source: Ambulatory Visit | Attending: Internal Medicine | Admitting: Internal Medicine

## 2016-03-01 ENCOUNTER — Encounter (INDEPENDENT_AMBULATORY_CARE_PROVIDER_SITE_OTHER): Payer: Self-pay

## 2016-03-01 DIAGNOSIS — R634 Abnormal weight loss: Secondary | ICD-10-CM

## 2016-03-01 DIAGNOSIS — R109 Unspecified abdominal pain: Secondary | ICD-10-CM | POA: Diagnosis not present

## 2016-03-01 MED ORDER — IOPAMIDOL (ISOVUE-300) INJECTION 61%
100.0000 mL | Freq: Once | INTRAVENOUS | Status: AC | PRN
  Administered 2016-03-01: 100 mL via INTRAVENOUS

## 2016-03-03 ENCOUNTER — Other Ambulatory Visit: Payer: Self-pay

## 2016-03-03 DIAGNOSIS — R935 Abnormal findings on diagnostic imaging of other abdominal regions, including retroperitoneum: Secondary | ICD-10-CM

## 2016-03-07 ENCOUNTER — Ambulatory Visit (HOSPITAL_COMMUNITY)
Admission: RE | Admit: 2016-03-07 | Discharge: 2016-03-07 | Disposition: A | Discharge status: Home / Self Care | Payer: BLUE CROSS/BLUE SHIELD | Source: Ambulatory Visit | Attending: Internal Medicine | Admitting: Internal Medicine

## 2016-03-07 DIAGNOSIS — K8020 Calculus of gallbladder without cholecystitis without obstruction: Secondary | ICD-10-CM | POA: Insufficient documentation

## 2016-03-07 DIAGNOSIS — R935 Abnormal findings on diagnostic imaging of other abdominal regions, including retroperitoneum: Secondary | ICD-10-CM | POA: Diagnosis present

## 2016-03-13 NOTE — Progress Notes (Signed)
Pt is being scheduled for preop appt; please place surgical orders in epic. Thanks.  

## 2016-03-14 NOTE — Patient Instructions (Addendum)
Johnlee Matsuyama  03/14/2016   Your procedure is scheduled on: 03/21/16  Report to [facility] Main  Entrance take [facility]  elevators to 3rd floor to  [place] at 1100 AM.  Call this number if you have problems the morning of surgery [phone]   Remember: ONLY 1 PERSON MAY GO WITH YOU TO SHORT STAY TO GET  READY MORNING OF [place].  Do not eat food:After Midnight.  MAY HAVE CLEAR LIQUIDS MORNING OF SURGERY UNTIL 0700am--THEN NOTHING BY MOUTH     Take these medicines the morning of surgery with A SIP OF WATER: NONE DO NOT TAKE ANY DIABETIC MEDICATIONS DAY OF YOUR SURGERY                               You may not have any metal on your body including hair pins and              piercings  Do not wear jewelry, make-up, lotions, powders or perfumes, deodorant             Do not wear nail polish.  Do not shave  48 hours prior to surgery.              Men may shave face and neck.   Do not bring valuables to the hospital. [place].  Contacts, dentures or bridgework may not be worn into surgery.  Leave suitcase in the car. After surgery it may be brought to your room.     Patients discharged the day of surgery will not be allowed to drive home.  Name and phone number of your driver:  Daughter  Fentress Cecilio [phone]  Special Instructions: N/A              Please read over the following fact sheets you were given: _____________________________________________________________________             [facility] - Preparing for Surgery Before surgery, you can play an important role.  Because skin is not sterile, your skin needs to be as free of germs as possible.  You can reduce the number of germs on your skin by washing with CHG (chlorahexidine gluconate) soap before surgery.  CHG is an antiseptic cleaner which kills germs and bonds with the skin to continue killing germs even after washing. Please DO NOT use if  you have an allergy to CHG or antibacterial soaps.  If your skin becomes reddened/irritated stop using the CHG and inform your nurse when you arrive at Short Stay. Do not shave (including legs and underarms) for at least 48 hours prior to the first CHG shower.  You may shave your face/neck. Please follow these instructions carefully:  1.  Shower with CHG Soap the night before surgery and the  morning of Surgery.  2.  If you choose to wash your hair, wash your hair first as usual with your  normal  shampoo.  3.  After you shampoo, rinse your hair and body thoroughly to remove the  shampoo.                           4.  Use CHG as you would any other liquid soap.  You can apply chg directly  to the skin and wash                       Gently with a scrungie or clean washcloth.  5.  Apply the CHG Soap to your body ONLY FROM THE NECK DOWN.   Do not use on face/ open                           Wound or open sores. Avoid contact with eyes, ears mouth and genitals (private parts).                       Wash face,  Genitals (private parts) with your normal soap.             6.  Wash thoroughly, paying special attention to the area where your surgery  will be performed.  7.  Thoroughly rinse your body with warm water from the neck down.  8.  DO NOT shower/wash with your normal soap after using and rinsing off  the CHG Soap.                9.  Pat yourself dry with a clean towel.            10.  Wear clean pajamas.            11.  Place clean sheets on your bed the night of your first shower and do not  sleep with pets. Day of Surgery : Do not apply any lotions/deodorants the morning of surgery.  Please wear clean clothes to the hospital/surgery center.  FAILURE TO FOLLOW THESE INSTRUCTIONS MAY RESULT IN THE CANCELLATION OF YOUR SURGERY PATIENT SIGNATURE_________________________________  NURSE  SIGNATURE__________________________________  ________________________________________________________________________    CLEAR LIQUID DIET   Foods Allowed                                                                     Foods Excluded  Coffee and tea, regular and decaf                             liquids that you cannot  Plain Jell-O in any flavor                                             see through such as: Fruit ices (not with fruit pulp)                                     milk, soups, orange juice  Iced Popsicles                                    All solid food Carbonated beverages, regular and diet  Cranberry, grape and apple juices Sports drinks like Gatorade Lightly seasoned clear broth or consume(fat free) Sugar, honey syrup  Sample Menu Breakfast                                Lunch                                     Supper Cranberry juice                    Beef broth                            Chicken broth Jell-O                                     Grape juice                           Apple juice Coffee or tea                        Jell-O                                      Popsicle                                                Coffee or tea                        Coffee or tea  _____________________________________________________________________

## 2016-03-14 NOTE — Progress Notes (Signed)
Bmp 02/28/16  epic

## 2016-03-15 ENCOUNTER — Other Ambulatory Visit: Payer: Self-pay | Admitting: Surgery

## 2016-03-16 ENCOUNTER — Encounter (HOSPITAL_COMMUNITY)
Admission: RE | Admit: 2016-03-16 | Discharge: 2016-03-16 | Disposition: A | Discharge status: Home / Self Care | Payer: BLUE CROSS/BLUE SHIELD | Source: Ambulatory Visit | Attending: Surgery | Admitting: Surgery

## 2016-03-16 ENCOUNTER — Encounter (HOSPITAL_COMMUNITY): Payer: Self-pay

## 2016-03-16 DIAGNOSIS — Z01812 Encounter for preprocedural laboratory examination: Secondary | ICD-10-CM | POA: Insufficient documentation

## 2016-03-16 DIAGNOSIS — K829 Disease of gallbladder, unspecified: Secondary | ICD-10-CM | POA: Insufficient documentation

## 2016-03-16 DIAGNOSIS — Z01810 Encounter for preprocedural cardiovascular examination: Secondary | ICD-10-CM | POA: Insufficient documentation

## 2016-03-16 LAB — CBC WITH DIFFERENTIAL/PLATELET
Basophils Absolute: 0 10*3/uL (ref 0.0–0.1)
Basophils Relative: 1 %
Eosinophils Absolute: 0.2 10*3/uL (ref 0.0–0.7)
Eosinophils Relative: 3 %
HCT: 47.8 % (ref 39.0–52.0)
Hemoglobin: 15.9 g/dL (ref 13.0–17.0)
Lymphocytes Relative: 47 %
Lymphs Abs: 3.7 10*3/uL (ref 0.7–4.0)
MCH: 31 pg (ref 26.0–34.0)
MCHC: 33.3 g/dL (ref 30.0–36.0)
MCV: 93.2 fL (ref 78.0–100.0)
Monocytes Absolute: 0.5 10*3/uL (ref 0.1–1.0)
Monocytes Relative: 6 %
Neutro Abs: 3.3 10*3/uL (ref 1.7–7.7)
Neutrophils Relative %: 43 %
Platelets: 241 10*3/uL (ref 150–400)
RBC: 5.13 MIL/uL (ref 4.22–5.81)
RDW: 13.1 % (ref 11.5–15.5)
WBC: 7.7 10*3/uL (ref 4.0–10.5)

## 2016-03-16 LAB — COMPREHENSIVE METABOLIC PANEL
ALBUMIN: 4.5 g/dL (ref 3.5–5.0)
ALT: 23 U/L (ref 17–63)
ANION GAP: 5 (ref 5–15)
AST: 22 U/L (ref 15–41)
Alkaline Phosphatase: 70 U/L (ref 38–126)
BUN: 16 mg/dL (ref 6–20)
CHLORIDE: 106 mmol/L (ref 101–111)
CO2: 29 mmol/L (ref 22–32)
Calcium: 9.6 mg/dL (ref 8.9–10.3)
Creatinine, Ser: 0.92 mg/dL (ref 0.61–1.24)
GFR calc non Af Amer: >60 mL/min (ref >60)
GLUCOSE: 91 mg/dL (ref 65–99)
POTASSIUM: 3.9 mmol/L (ref 3.5–5.1)
SODIUM: 140 mmol/L (ref 135–145)
Total Bilirubin: 0.8 mg/dL (ref 0.3–1.2)
Total Protein: 7.8 g/dL (ref 6.5–8.1)

## 2016-03-19 ENCOUNTER — Telehealth: Payer: Self-pay | Admitting: *Deleted
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
## 2016-03-21 ENCOUNTER — Ambulatory Visit (HOSPITAL_COMMUNITY): Payer: BLUE CROSS/BLUE SHIELD

## 2016-03-21 ENCOUNTER — Ambulatory Visit (HOSPITAL_COMMUNITY)
Admission: RE | Admit: 2016-03-21 | Discharge: 2016-03-21 | Disposition: A | Discharge status: Home / Self Care | Payer: BLUE CROSS/BLUE SHIELD | Source: Ambulatory Visit | Attending: Surgery | Admitting: Surgery

## 2016-03-21 ENCOUNTER — Encounter (HOSPITAL_COMMUNITY): Payer: Self-pay

## 2016-03-21 ENCOUNTER — Ambulatory Visit (HOSPITAL_COMMUNITY): Payer: BLUE CROSS/BLUE SHIELD | Admitting: Anesthesiology

## 2016-03-21 ENCOUNTER — Encounter (HOSPITAL_COMMUNITY)
Admission: RE | Disposition: A | Discharge status: Home / Self Care | Payer: Self-pay | Source: Ambulatory Visit | Attending: Surgery

## 2016-03-21 DIAGNOSIS — K8010 Calculus of gallbladder with chronic cholecystitis without obstruction: Secondary | ICD-10-CM | POA: Diagnosis not present

## 2016-03-21 DIAGNOSIS — Z87891 Personal history of nicotine dependence: Secondary | ICD-10-CM | POA: Diagnosis not present

## 2016-03-21 DIAGNOSIS — K219 Gastro-esophageal reflux disease without esophagitis: Secondary | ICD-10-CM | POA: Insufficient documentation

## 2016-03-21 DIAGNOSIS — Z86010 Personal history of colonic polyps: Secondary | ICD-10-CM | POA: Diagnosis not present

## 2016-03-21 DIAGNOSIS — K829 Disease of gallbladder, unspecified: Secondary | ICD-10-CM

## 2016-03-21 HISTORY — PX: CHOLECYSTECTOMY: SHX55

## 2016-03-21 SURGERY — LAPAROSCOPIC CHOLECYSTECTOMY WITH INTRAOPERATIVE CHOLANGIOGRAM
Anesthesia: General | Site: Abdomen

## 2016-03-21 MED ORDER — PROMETHAZINE HCL 25 MG/ML IJ SOLN: 6.2500 mg | INTRAMUSCULAR | Status: DC | PRN

## 2016-03-21 MED ORDER — BUPIVACAINE HCL (PF) 0.5 % IJ SOLN
INTRAMUSCULAR | Status: AC
  Filled 2016-03-21: qty 30

## 2016-03-21 MED ORDER — CEFAZOLIN SODIUM-DEXTROSE 2-4 GM/100ML-% IV SOLN
2.0000 g | INTRAVENOUS | Status: AC
  Administered 2016-03-21: 2 g via INTRAVENOUS
  Filled 2016-03-21: qty 100

## 2016-03-21 MED ORDER — PROPOFOL 10 MG/ML IV BOLUS
INTRAVENOUS | Status: AC
  Filled 2016-03-21: qty 20

## 2016-03-21 MED ORDER — EPHEDRINE SULFATE 50 MG/ML IJ SOLN
INTRAMUSCULAR | Status: DC | PRN
  Administered 2016-03-21: 25 mg via INTRAVENOUS

## 2016-03-21 MED ORDER — IOPAMIDOL (ISOVUE-300) INJECTION 61%
INTRAVENOUS | Status: AC
  Filled 2016-03-21: qty 50

## 2016-03-21 MED ORDER — PHENYLEPHRINE 40 MCG/ML (10ML) SYRINGE FOR IV PUSH (FOR BLOOD PRESSURE SUPPORT)
PREFILLED_SYRINGE | INTRAVENOUS | Status: AC
  Filled 2016-03-21: qty 10

## 2016-03-21 MED ORDER — LIDOCAINE 2% (20 MG/ML) 5 ML SYRINGE
INTRAMUSCULAR | Status: AC
  Filled 2016-03-21: qty 5

## 2016-03-21 MED ORDER — MIDAZOLAM HCL 5 MG/5ML IJ SOLN
INTRAMUSCULAR | Status: DC | PRN
  Administered 2016-03-21: 2 mg via INTRAVENOUS

## 2016-03-21 MED ORDER — CHLORHEXIDINE GLUCONATE CLOTH 2 % EX PADS: 6.0000 | MEDICATED_PAD | Freq: Once | CUTANEOUS | Status: DC

## 2016-03-21 MED ORDER — LACTATED RINGERS IV SOLN
INTRAVENOUS | Status: DC
  Administered 2016-03-21 (×3): via INTRAVENOUS

## 2016-03-21 MED ORDER — LIDOCAINE HCL (CARDIAC) 20 MG/ML IV SOLN
INTRAVENOUS | Status: DC | PRN
  Administered 2016-03-21: 60 mg via INTRAVENOUS

## 2016-03-21 MED ORDER — SUGAMMADEX SODIUM 200 MG/2ML IV SOLN
INTRAVENOUS | Status: DC | PRN
  Administered 2016-03-21: 200 mg via INTRAVENOUS

## 2016-03-21 MED ORDER — IOPAMIDOL (ISOVUE-300) INJECTION 61%
INTRAVENOUS | Status: DC | PRN
  Administered 2016-03-21: 4 mL

## 2016-03-21 MED ORDER — MIDAZOLAM HCL 2 MG/2ML IJ SOLN
INTRAMUSCULAR | Status: AC
  Filled 2016-03-21: qty 2

## 2016-03-21 MED ORDER — FENTANYL CITRATE (PF) 250 MCG/5ML IJ SOLN
INTRAMUSCULAR | Status: AC
  Filled 2016-03-21: qty 5

## 2016-03-21 MED ORDER — FENTANYL CITRATE (PF) 100 MCG/2ML IJ SOLN
INTRAMUSCULAR | Status: DC | PRN
  Administered 2016-03-21 (×2): 100 ug via INTRAVENOUS
  Administered 2016-03-21: 50 ug via INTRAVENOUS

## 2016-03-21 MED ORDER — HYDROCODONE-ACETAMINOPHEN 5-325 MG PO TABS: 1.0000 | ORAL_TABLET | Freq: Four times a day (QID) | ORAL | 0 refills | Status: DC | PRN

## 2016-03-21 MED ORDER — SUGAMMADEX SODIUM 200 MG/2ML IV SOLN
INTRAVENOUS | Status: AC
Start: 2016-03-21 — End: 2016-03-21
  Filled 2016-03-21: qty 6

## 2016-03-21 MED ORDER — DEXAMETHASONE SODIUM PHOSPHATE 10 MG/ML IJ SOLN
INTRAMUSCULAR | Status: DC | PRN
  Administered 2016-03-21: 10 mg via INTRAVENOUS

## 2016-03-21 MED ORDER — ROCURONIUM BROMIDE 10 MG/ML (PF) SYRINGE
PREFILLED_SYRINGE | INTRAVENOUS | Status: AC
  Filled 2016-03-21: qty 10

## 2016-03-21 MED ORDER — EPHEDRINE 5 MG/ML INJ
INTRAVENOUS | Status: AC
  Filled 2016-03-21: qty 10

## 2016-03-21 MED ORDER — LACTATED RINGERS IR SOLN
Status: DC | PRN
  Administered 2016-03-21: 1000 mL

## 2016-03-21 MED ORDER — ACETAMINOPHEN 500 MG PO TABS
1000.0000 mg | ORAL_TABLET | ORAL | Status: AC
  Administered 2016-03-21: 1000 mg via ORAL
  Filled 2016-03-21: qty 2

## 2016-03-21 MED ORDER — 0.9 % SODIUM CHLORIDE (POUR BTL) OPTIME
TOPICAL | Status: DC | PRN
  Administered 2016-03-21: 1000 mL

## 2016-03-21 MED ORDER — SUCCINYLCHOLINE CHLORIDE 20 MG/ML IJ SOLN
INTRAMUSCULAR | Status: DC | PRN
  Administered 2016-03-21: 140 mg via INTRAVENOUS

## 2016-03-21 MED ORDER — ONDANSETRON HCL 4 MG/2ML IJ SOLN
INTRAMUSCULAR | Status: DC | PRN
  Administered 2016-03-21: 4 mg via INTRAVENOUS

## 2016-03-21 MED ORDER — PROPOFOL 10 MG/ML IV BOLUS
INTRAVENOUS | Status: DC | PRN
  Administered 2016-03-21: 150 mg via INTRAVENOUS

## 2016-03-21 MED ORDER — FENTANYL CITRATE (PF) 100 MCG/2ML IJ SOLN: 25.0000 ug | INTRAMUSCULAR | Status: DC | PRN

## 2016-03-21 MED ORDER — GABAPENTIN 300 MG PO CAPS
300.0000 mg | ORAL_CAPSULE | ORAL | Status: AC
  Administered 2016-03-21: 300 mg via ORAL
  Filled 2016-03-21: qty 1

## 2016-03-21 MED ORDER — HYDROCODONE-ACETAMINOPHEN 5-325 MG PO TABS: 1.0000 | ORAL_TABLET | Freq: Four times a day (QID) | ORAL | Status: DC | PRN

## 2016-03-21 MED ORDER — ROCURONIUM BROMIDE 100 MG/10ML IV SOLN
INTRAVENOUS | Status: DC | PRN
  Administered 2016-03-21: 30 mg via INTRAVENOUS

## 2016-03-21 MED ORDER — DEXAMETHASONE SODIUM PHOSPHATE 10 MG/ML IJ SOLN
INTRAMUSCULAR | Status: AC
  Filled 2016-03-21: qty 1

## 2016-03-21 MED ORDER — CEFAZOLIN SODIUM-DEXTROSE 2-4 GM/100ML-% IV SOLN
INTRAVENOUS | Status: AC
  Filled 2016-03-21: qty 100

## 2016-03-21 SURGICAL SUPPLY — 39 items
APPLIER CLIP 5 13 M/L LIGAMAX5 (MISCELLANEOUS) ×3
APPLIER CLIP ROT 10 11.4 M/L (STAPLE)
BENZOIN TINCTURE PRP APPL 2/3 (GAUZE/BANDAGES/DRESSINGS) IMPLANT
CABLE HIGH FREQUENCY MONO STRZ (ELECTRODE) ×3 IMPLANT
CHLORAPREP W/TINT 26ML (MISCELLANEOUS) ×3 IMPLANT
CHOLANGIOGRAM CATH TAUT (CATHETERS) ×3 IMPLANT
CLIP APPLIE 5 13 M/L LIGAMAX5 (MISCELLANEOUS) ×1 IMPLANT
CLIP APPLIE ROT 10 11.4 M/L (STAPLE) IMPLANT
CLOSURE WOUND 1/4X4 (GAUZE/BANDAGES/DRESSINGS)
COVER MAYO STAND STRL (DRAPES) ×3 IMPLANT
COVER SURGICAL LIGHT HANDLE (MISCELLANEOUS) ×3 IMPLANT
DECANTER SPIKE VIAL GLASS SM (MISCELLANEOUS) ×3 IMPLANT
DERMABOND ADVANCED (GAUZE/BANDAGES/DRESSINGS) ×2
DERMABOND ADVANCED .7 DNX12 (GAUZE/BANDAGES/DRESSINGS) ×1 IMPLANT
DRAPE C-ARM 42X120 X-RAY (DRAPES) ×3 IMPLANT
ELECT REM PT RETURN 9FT ADLT (ELECTROSURGICAL) ×3
ELECTRODE REM PT RTRN 9FT ADLT (ELECTROSURGICAL) ×1 IMPLANT
GLOVE SURG SIGNA 7.5 PF LTX (GLOVE) ×3 IMPLANT
GOWN STRL REUS W/TWL XL LVL3 (GOWN DISPOSABLE) ×9 IMPLANT
HEMOSTAT SURGICEL 4X8 (HEMOSTASIS) IMPLANT
IRRIG SUCT STRYKERFLOW 2 WTIP (MISCELLANEOUS) ×3
IRRIGATION SUCT STRKRFLW 2 WTP (MISCELLANEOUS) ×1 IMPLANT
IV CATH 14GX2 1/4 (CATHETERS) ×3 IMPLANT
IV SET EXTENSION CATH 6 NF (IV SETS) ×3 IMPLANT
KIT BASIN OR (CUSTOM PROCEDURE TRAY) ×3 IMPLANT
POUCH SPECIMEN RETRIEVAL 10MM (ENDOMECHANICALS) ×3 IMPLANT
SCISSORS LAP 5X35 DISP (ENDOMECHANICALS) ×3 IMPLANT
SLEEVE ADV FIXATION 5X100MM (TROCAR) ×3 IMPLANT
STOPCOCK 4 WAY LG BORE MALE ST (IV SETS) ×3 IMPLANT
STRIP CLOSURE SKIN 1/4X4 (GAUZE/BANDAGES/DRESSINGS) IMPLANT
SUT MNCRL AB 4-0 PS2 18 (SUTURE) ×3 IMPLANT
SYR 10ML ECCENTRIC (SYRINGE) ×3 IMPLANT
TOWEL OR 17X26 10 PK STRL BLUE (TOWEL DISPOSABLE) ×3 IMPLANT
TOWEL OR NON WOVEN STRL DISP B (DISPOSABLE) ×3 IMPLANT
TRAY LAPAROSCOPIC (CUSTOM PROCEDURE TRAY) ×3 IMPLANT
TROCAR ADV FIXATION 11X100MM (TROCAR) IMPLANT
TROCAR ADV FIXATION 5X100MM (TROCAR) ×6 IMPLANT
TROCAR XCEL BLUNT TIP 100MML (ENDOMECHANICALS) ×3 IMPLANT
TUBING INSUF HEATED (TUBING) ×3 IMPLANT

## 2016-03-21 NOTE — Anesthesia Procedure Notes (Signed)
Procedure Name: Intubation Date/Time: 03/21/2016 2:18 PM Performed by: Gaston Islam EVETTE Pre-anesthesia Checklist: Patient identified, Timeout performed, Emergency Drugs available, Suction available and Patient being monitored Patient Re-evaluated:Patient Re-evaluated prior to inductionOxygen Delivery Method: Circle system utilized Preoxygenation: Pre-oxygenation with 100% oxygen Intubation Type: IV induction Ventilation: Mask ventilation without difficulty Laryngoscope Size: Mac and 5 Grade View: Grade I Tube size: 7.5 mm Number of attempts: 1 Airway Equipment and Method: Patient positioned with wedge pillow and Stylet Placement Confirmation: ETT inserted through vocal cords under direct vision,  positive ETCO2 and breath sounds checked- equal and bilateral Secured at: 23 cm Tube secured with: Tape Dental Injury: Teeth and Oropharynx as per pre-operative assessment

## 2016-03-21 NOTE — Transfer of Care (Signed)
Immediate Anesthesia Transfer of Care Note  Patient: Marcus Massey  Procedure(s) Performed: Procedure(s): LAPAROSCOPIC CHOLECYSTECTOMY WITH INTRAOPERATIVE CHOLANGIOGRAM (N/A)  Patient Location: PACU  Anesthesia Type:General  Level of Consciousness: awake and alert   Airway & Oxygen Therapy: Patient Spontanous Breathing and Patient connected to face mask oxygen  Post-op Assessment: Report given to RN and Post -op Vital signs reviewed and stable  Post vital signs: Reviewed and stable  Last Vitals:  Vitals:   03/21/16 1107 03/21/16 1545  BP: 120/84 (!) 131/92  Pulse: 71   Resp: 18   Temp: 36.9 C 36.3 C    Last Pain:  Vitals:   03/21/16 1107  TempSrc: Oral      Patients Stated Pain Goal: 4 ([id number])  Complications: No apparent anesthesia complications

## 2016-03-21 NOTE — Anesthesia Postprocedure Evaluation (Signed)
Anesthesia Post Note  Patient: Marcus Massey  Procedure(s) Performed: Procedure(s) (LRB): LAPAROSCOPIC CHOLECYSTECTOMY WITH INTRAOPERATIVE CHOLANGIOGRAM (N/A)  Patient location during evaluation: PACU Anesthesia Type: General Level of consciousness: awake and alert Pain management: pain level controlled Vital Signs Assessment: post-procedure vital signs reviewed and stable Respiratory status: spontaneous breathing, nonlabored ventilation, respiratory function stable and patient connected to nasal cannula oxygen Cardiovascular status: blood pressure returned to baseline and stable Postop Assessment: no signs of nausea or vomiting Anesthetic complications: no    Last Vitals:  Vitals:   03/21/16 1623 03/21/16 1813  BP: 131/84 129/82  Pulse: 79 82  Resp: 15 16  Temp: 36.4 C 36.4 C    Last Pain:  Vitals:   03/21/16 1813  TempSrc: Oral  PainSc: 2                  Caeley Dohrmann J

## 2016-03-21 NOTE — Anesthesia Preprocedure Evaluation (Signed)
Anesthesia Evaluation  Patient identified by MRN, date of birth, ID band Patient awake    Reviewed: Allergy & Precautions, NPO status , Patient's Chart, lab work & pertinent test results  Airway Mallampati: II  TM Distance: >3 FB Neck ROM: Full    Dental no notable dental hx.    Pulmonary former smoker,    Pulmonary exam normal breath sounds clear to auscultation       Cardiovascular negative cardio ROS Normal cardiovascular exam Rhythm:Regular Rate:Normal     Neuro/Psych negative neurological ROS  negative psych ROS   GI/Hepatic negative GI ROS, Neg liver ROS,   Endo/Other  negative endocrine ROS  Renal/GU negative Renal ROS  negative genitourinary   Musculoskeletal negative musculoskeletal ROS (+)   Abdominal   Peds negative pediatric ROS (+)  Hematology negative hematology ROS (+)   Anesthesia Other Findings   Reproductive/Obstetrics negative OB ROS                             Anesthesia Physical Anesthesia Plan  ASA: II  Anesthesia Plan: General   Post-op Pain Management:    Induction: Intravenous  Airway Management Planned: Oral ETT  Additional Equipment:   Intra-op Plan:   Post-operative Plan: Extubation in OR  Informed Consent: I have reviewed the patients History and Physical, chart, labs and discussed the procedure including the risks, benefits and alternatives for the proposed anesthesia with the patient or authorized representative who has indicated his/her understanding and acceptance.   Dental advisory given  Plan Discussed with: CRNA  Anesthesia Plan Comments:         Anesthesia Quick Evaluation  

## 2016-03-21 NOTE — Op Note (Signed)
03/21/2016  3:31 PM  PATIENT:  Marcus Massey, 65 y.o., male, MRN: [id number]  PREOP DIAGNOSIS:  gallbladder disease  POSTOP DIAGNOSIS:   Chronic cholecystitis, cholelithiasis  PROCEDURE:   Procedure(s):  LAPAROSCOPIC CHOLECYSTECTOMY WITH INTRAOPERATIVE CHOLANGIOGRAM  SURGEON:   Alphonsa Overall, M.D.  Terrence DupontRockne Coons, M.D.  ANESTHESIA:   general  Anesthesiologist: Franne Grip, MD CRNA: Cynda Familia, CRNA; Shantella Trudi Ida, CRNA  General  ASA: 2  EBL:  minimal  ml  BLOOD ADMINISTERED: none  DRAINS: none   LOCAL MEDICATIONS USED:   30 cc 1/4% marcaine  SPECIMEN:   Gall bladder  COUNTS CORRECT:  YES  INDICATIONS FOR PROCEDURE:  Marcus Massey is a 65 y.o. (DOB: [birth year]) [place] male whose primary care physician is No PCP Per Patient and comes for cholecystectomy.   He has seen Dr. Rikki Spearing for some GI complaints.  His upper endo and colonoscopy on 02/23/2016 were negative.  [place] has showed gallstones with gall bladder wall thickening.   The indications and risks of the gall bladder surgery were explained to the patient.  The risks include, but are not limited to, infection, bleeding, common bile duct injury and open surgery.  SURGERY:  The patient was taken to OR room #1 at [facility].  The abdomen was prepped with chloroprep.  The patient was given 2 gm Ancef at the beginning of the operation.   A time out was held and the surgical checklist run.   An infraumbilical incision was made into the abdominal cavity.  A 12 mm Hasson trocar was inserted into the abdominal cavity through the infraumbilical incision and secured with a 0 Vicryl suture.  Three additional trocars were inserted: a 5 mm trocar in the sub-xiphoid location, a 5 mm trocar in the right mid subcostal area, and a 5 mm trocar in the right lateral subcostal area.   The abdomen was explored and the liver, stomach, and bowel that could be seen were unremarkable.   The gall bladder was whitish,  consistent with gall bladder disease.  He had some filmy adhesions to the omentum and duodenum.  These were taken down sharply.   I grasped the gall bladder and rotated it cephalad.  Disssection was carried down to the gall bladder/cystic duct junction and the cystic duct isolated.  A clip was placed on the gall bladder side of the cystic duct.   An intra-operative cholangiogram was shot.   The intra-operative cholangiogram was shot using a cut off Taut catheter placed through a 14 gauge angiocath in the RUQ.  The Taut catheter was inserted in the cut cystic duct and secured with an endoclip.  A cholangiogram was shot with 10 cc of 1/2 strength Isoview.  Using fluoroscopy, the cholangiogram showed the flow of contrast into the common bile duct, up the hepatic radicals, and into the duodenum.  There was no mass or obstruction.  This was a normal intra-operative cholangiogram.   The Taut catheter was removed.  The cystic duct was tripley endoclipped and the cystic artery was identified and clipped.  The gall bladder was bluntly and sharpley dissected from the gall bladder bed.   After the gall bladder was removed from the liver, the gall bladder bed and [place] were inspected.  There was no bleeding or bile leak.  The gall bladder was placed in a endocatch bag and delivered through the umbilicus.  The abdomen was irrigated with 800 cc saline.   The trocars were  then removed.  I infiltrated 30 cc of 1/4% Marcaine into the incisions.  The umbilical port closed with a 0 Vicryl suture and the skin closed with 4-0 Monocryl.  The skin was painted with DermaBond.  The patient's sponge and needle count were correct.  The patient was transported to the RR in good condition.  Alphonsa Overall, MD, [facility] Pager: [phone] Office phone:  [phone]

## 2016-03-21 NOTE — Interval H&P Note (Signed)
History and Physical Interval Note:  03/21/2016 1:23 PM  Marcus Massey  has presented today for surgery, with the diagnosis of gallbladder disease  The various methods of treatment have been discussed with the patient and family.  Daughter, Butch Penny, here with patient.  After consideration of risks, benefits and other options for treatment, the patient has consented to  Procedure(s): LAPAROSCOPIC CHOLECYSTECTOMY WITH INTRAOPERATIVE CHOLANGIOGRAM (N/A) as a surgical intervention .  The patient's history has been reviewed, patient examined, no change in status, stable for surgery.  I have reviewed the patient's chart and labs.  Questions were answered to the patient's satisfaction.     Lenise Jr H

## 2016-03-21 NOTE — Discharge Instructions (Signed)
General Anesthesia, Adult, Care After Refer to this sheet in the next few weeks. These instructions provide you with information on caring for yourself after your procedure. Your health care provider may also give you more specific instructions. Your treatment has been planned according to current medical practices, but problems sometimes occur. Call your health care provider if you have any problems or questions after your procedure. WHAT TO EXPECT AFTER THE PROCEDURE After the procedure, it is typical to experience:  Sleepiness.  Nausea and vomiting. HOME CARE INSTRUCTIONS  For the first 24 hours after general anesthesia:  Have a responsible person with you.  Do not drive a car. If you are alone, do not take public transportation.  Do not drink alcohol.  Do not take medicine that has not been prescribed by your health care provider.  Do not sign important papers or make important decisions.  You may resume a normal diet and activities as directed by your health care provider.  Change bandages (dressings) as directed.  If you have questions or problems that seem related to general anesthesia, call the hospital and ask for the anesthetist or anesthesiologist on call. SEEK MEDICAL CARE IF:  You have nausea and vomiting that continue the day after anesthesia.  You develop a rash. SEEK IMMEDIATE MEDICAL CARE IF:   You have difficulty breathing.  You have chest pain.  You have any allergic problems.   This information is not intended to replace advice given to you by your health care provider. Make sure you discuss any questions you have with your health care provider.   Document Released: 09/04/2000 Document Revised: 06/19/2014 Document Reviewed: 09/27/2011 Elsevier Interactive Patient Education 2016 [place] TO PATIENT  Activity:  Driving - May drive in 3 or 4 days, if doing well.   Lifting - No lifting more  than 15 pounds for 7 days, then no limit.  Wound Care:   Leave incision dry for 2 days, then may shower.       May put ice over wound.  Diet:  As tolerated.  Follow up appointment:  Call Dr. Pollie Friar office [facility]) at [phone] for an appointment in 2 to 3 week.s  Medications and dosages:  Resume your home medications.  You have a prescription for:  Vicodin  Call Dr. Lucia Gaskins or his office  [phone]) if you have:  Temperature greater than 100.4,  Persistent nausea and vomiting,  Severe uncontrolled pain,  Redness, tenderness, or signs of infection (pain, swelling, redness, odor or green/yellow discharge around the site),  Difficulty breathing, headache or visual disturbances,  Any other questions or concerns you may have after discharge.  In an emergency, call 911 or go to an Emergency Department at a nearby hospital.

## 2016-03-22 ENCOUNTER — Encounter (HOSPITAL_COMMUNITY): Payer: Self-pay | Admitting: Surgery

## 2016-03-26 ENCOUNTER — Encounter: Payer: Self-pay | Admitting: *Deleted

## 2016-04-18 ENCOUNTER — Encounter: Payer: Self-pay | Admitting: Internal Medicine

## 2016-04-18 ENCOUNTER — Ambulatory Visit: Payer: BLUE CROSS/BLUE SHIELD | Admitting: Internal Medicine

## 2016-04-18 ENCOUNTER — Ambulatory Visit (INDEPENDENT_AMBULATORY_CARE_PROVIDER_SITE_OTHER): Payer: BLUE CROSS/BLUE SHIELD | Admitting: Internal Medicine

## 2016-04-18 VITALS — BP 104/60 | HR 70 | Ht 65.0 in | Wt 170.0 lb

## 2016-04-18 DIAGNOSIS — K8020 Calculus of gallbladder without cholecystitis without obstruction: Secondary | ICD-10-CM

## 2016-04-18 DIAGNOSIS — R1084 Generalized abdominal pain: Secondary | ICD-10-CM

## 2016-04-18 NOTE — Progress Notes (Signed)
HISTORY OF PRESENT ILLNESS:  Marcus Massey is a 65 y.o. male  who presents today for follow-up. Last evaluated in the office 01/28/2016 sensation of difficulty digesting. He is also had weight loss. Previously evaluated for epigastric pain. Colonoscopy was performed 02/23/2016. Examination revealed multiple diminutive adenomatous colon polyps and internal hemorrhoids. Follow-up in 3 years recommended. Upper endoscopy was normal. Contrast-enhanced CT scan of the abdomen and pelvis following week revealed moderate gallbladder wall thickening. Subsequent ultrasound revealed cholelithiasis. He was referred to Dr. Alphonsa Overall an approximate 3 weeks ago underwent laparoscopic cholecystectomy. He is recovering well and presents today for follow-up. He reports no problems with pain. Principal GI complaints also seem to be improved. He has minimal to no tenderness at the laparoscopic incision sites. He is very pleased.  REVIEW OF SYSTEMS:  All non-GI ROS negative bone comprehensive review  Past Medical History:  Diagnosis Date  . GERD (gastroesophageal reflux disease)   . Prediabetes     Past Surgical History:  Procedure Laterality Date  . CHOLECYSTECTOMY N/A 03/21/2016   Procedure: LAPAROSCOPIC CHOLECYSTECTOMY WITH INTRAOPERATIVE CHOLANGIOGRAM;  Surgeon: Alphonsa Overall, MD;  Location: [facility] ORS;  Service: General;  Laterality: N/A;  . COLONOSCOPY    . ESOPHAGOGASTRODUODENOSCOPY ENDOSCOPY    . NASAL RECONSTRUCTION WITH SEPTAL REPAIR      Social History Marcus Massey  reports that he quit smoking about 14 months ago. He smoked 0.00 packs per day. He has never used smokeless tobacco. He reports that he does not drink alcohol or use drugs.  family history includes Diabetes in his brother; Liver disease in his father.  No Known Allergies     PHYSICAL EXAMINATION: Vital signs: BP 104/60   Pulse 70   Ht 5\' 5"  (1.651 m)   Wt 170 lb (77.1 kg)   BMI 28.29 kg/m   Constitutional: generally well-appearing,  no acute distress Psychiatric: alert and oriented x3, cooperative Eyes: extraocular movements intact, anicteric, conjunctiva pink Mouth: oral pharynx moist, no lesions Neck: supple no lymphadenopathy Cardiovascular: heart regular rate and rhythm, no murmur Lungs: clear to auscultation bilaterally Abdomen: soft, nontender, nondistended, no obvious ascites, no peritoneal signs, normal bowel sounds, no organomegaly. Microscopic stab was well healed Rectal:Omitted Extremities: no clubbing, cyanosis, or lower extremity edema bilaterally Skin: no lesions on visible extremities Neuro: No focal deficits. No asterixis.   ASSESSMENT:  #1. symptomatic cholelithiasis status post cholecystectomy #2. Normal EGD #3. Multiple adenomatous colon polyps  PLAN:  #1. Reassurance regarding findings and the meaning of findings. #2. Surveillance colonoscopy 3 years #3. Return to PCP

## 2016-04-18 NOTE — Patient Instructions (Signed)
Please follow up as needed.  Your next colonoscopy will be due in 2020

## 2017-02-26 IMAGING — CT CT ABD-PELV W/ CM
2 of 5 series · 15 of 46 positions shown, 17 images · IV contrast (ISOVUE 300)
Comparison: None.

CLINICAL DATA: Intermittent abdominal pain and postprandial
bloating.

EXAM:
CT ABDOMEN AND PELVIS WITH CONTRAST
TECHNIQUE: Multidetector CT imaging of the abdomen and pelvis was performed
using the standard protocol following bolus administration of
intravenous contrast.
CONTRAST:  100mL KFA8L8-M00 IOPAMIDOL (KFA8L8-M00) INJECTION 61%

[Series 2: abd/ pelvis · axial · 0.71mm/px · z∈[-543,-128]mm · 12 of 93 slices shown, 14 images]
[im 5/93  soft-tissue]
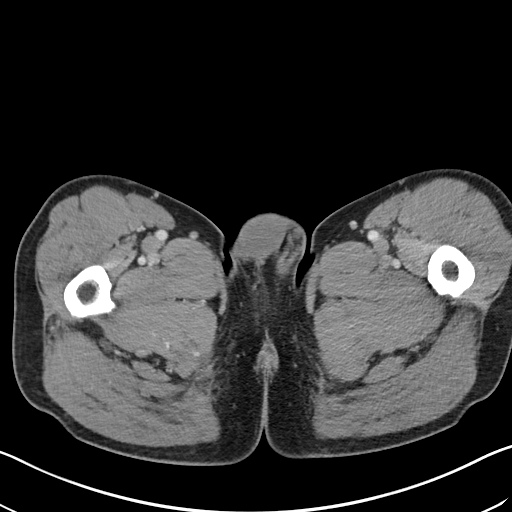
[im 5/93  bone]
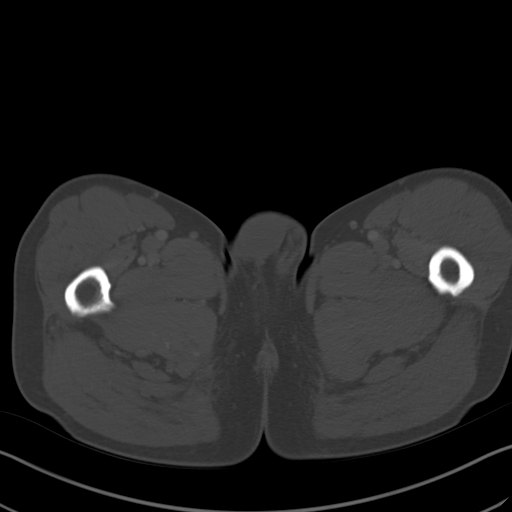
[im 14/93  soft-tissue]
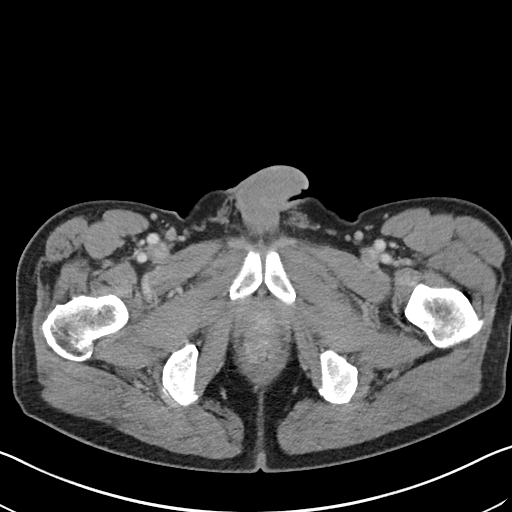
[im 19/93  soft-tissue]
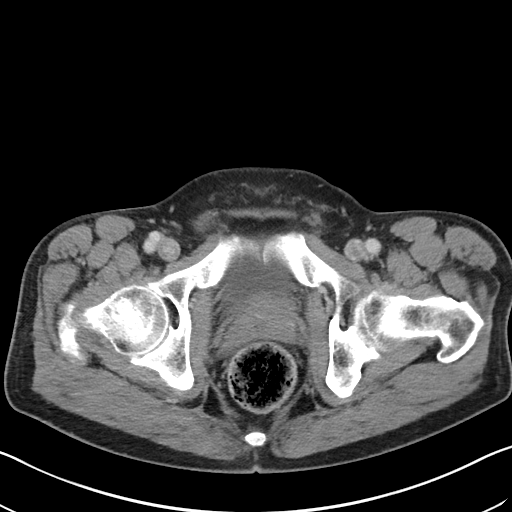
[im 28/93  soft-tissue]
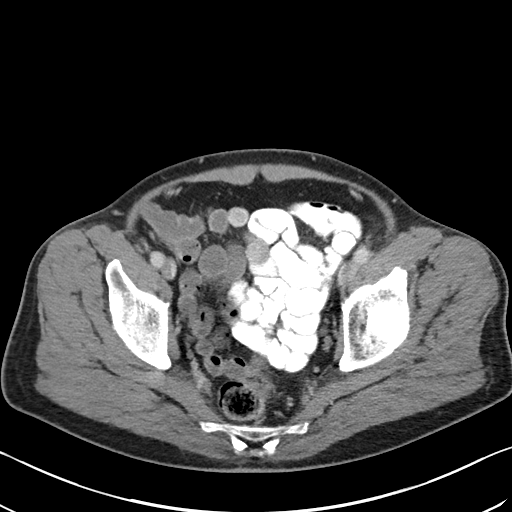
[im 37/93  soft-tissue]
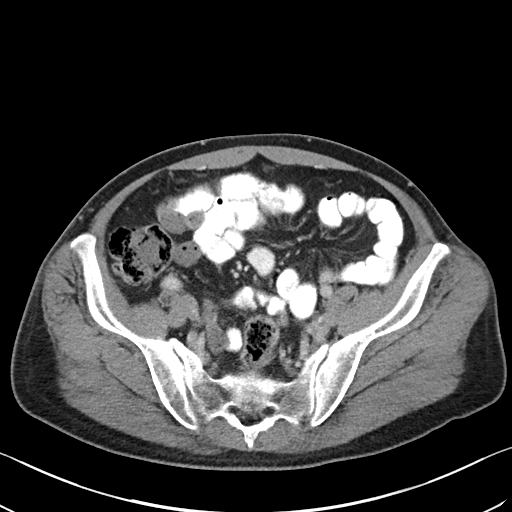
[im 42/93  soft-tissue]
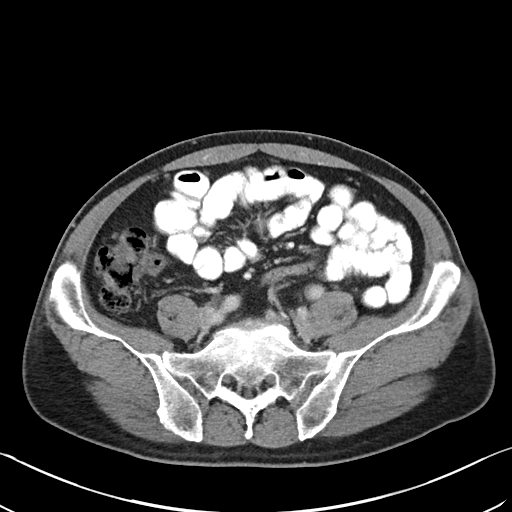
[im 51/93  soft-tissue]
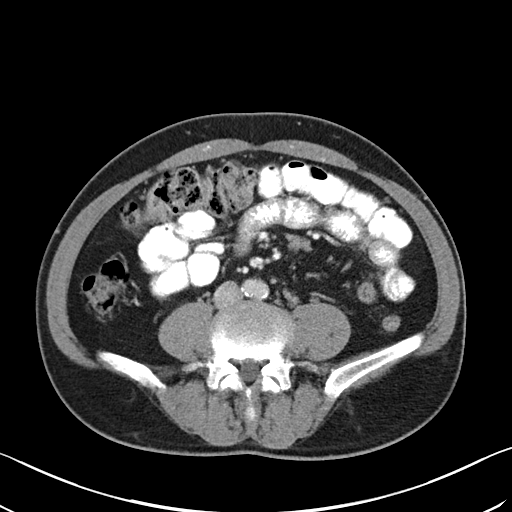
[im 56/93  soft-tissue]
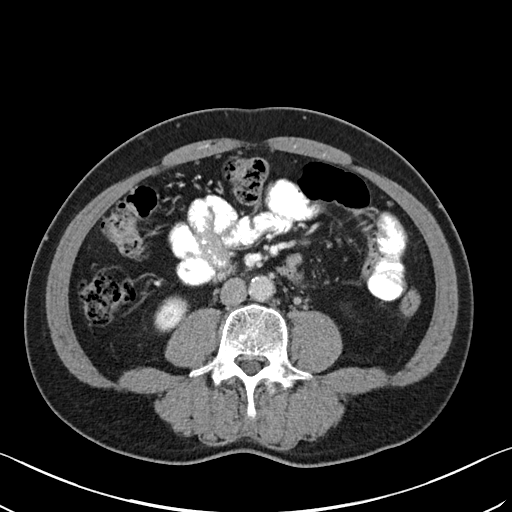
[im 65/93  soft-tissue]
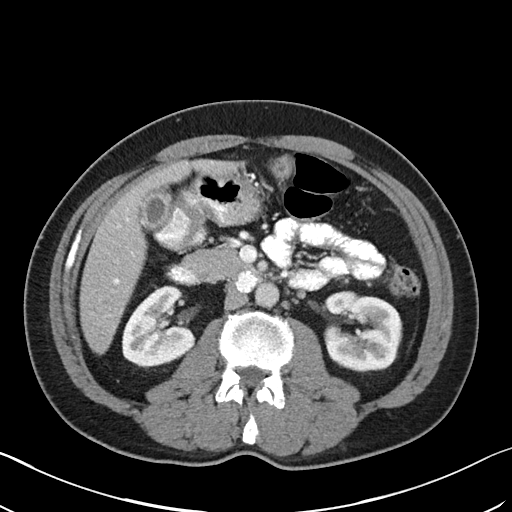
[im 65/93  bone]
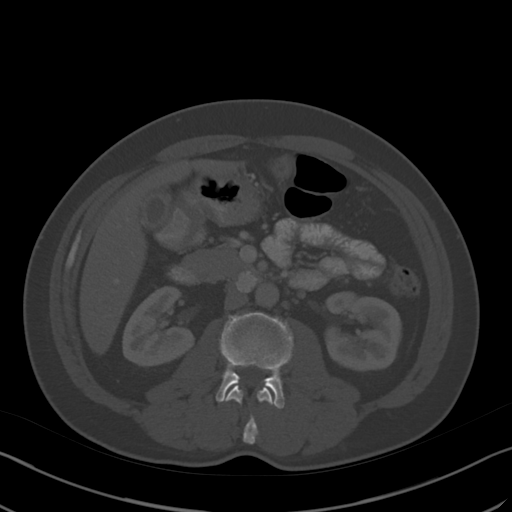
[im 74/93  soft-tissue]
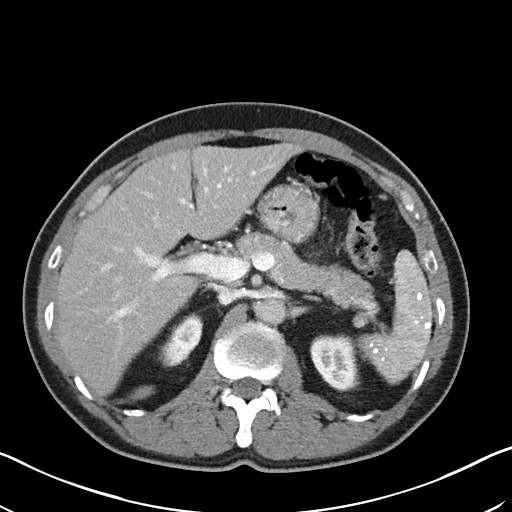
[im 79/93  soft-tissue]
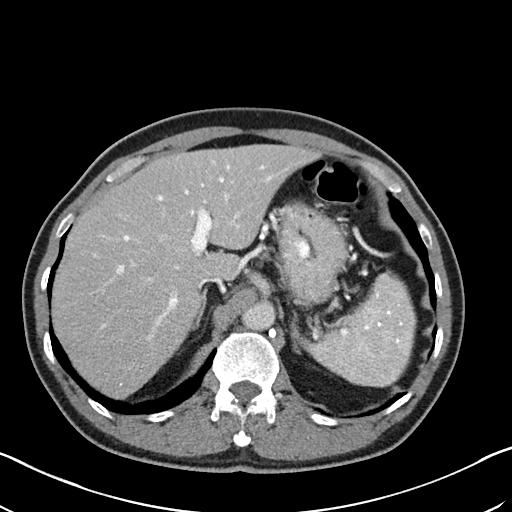
[im 88/93  soft-tissue]
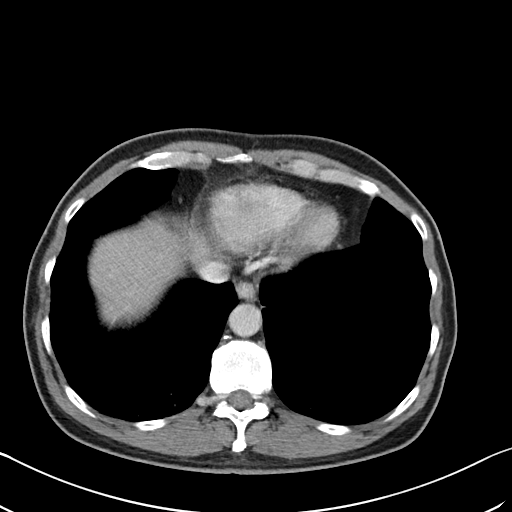

[Series 6: coronal soft tissue · coronal · 0.65mm/px · 3 of 82 slices shown]
[im 28/82  soft-tissue]
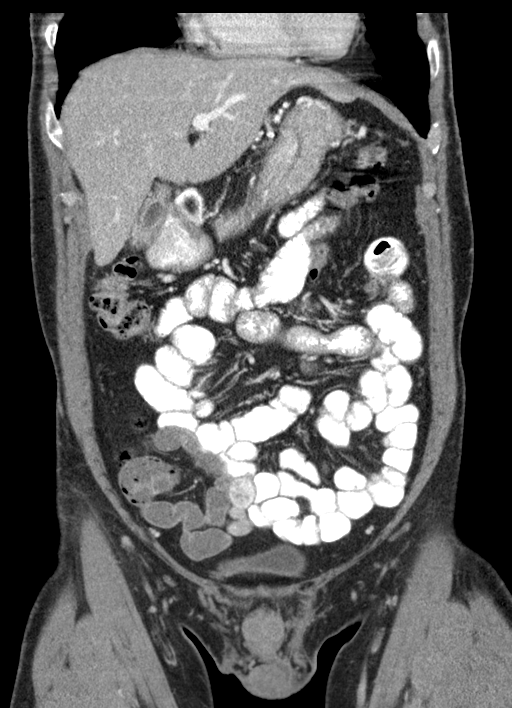
[im 37/82  soft-tissue]
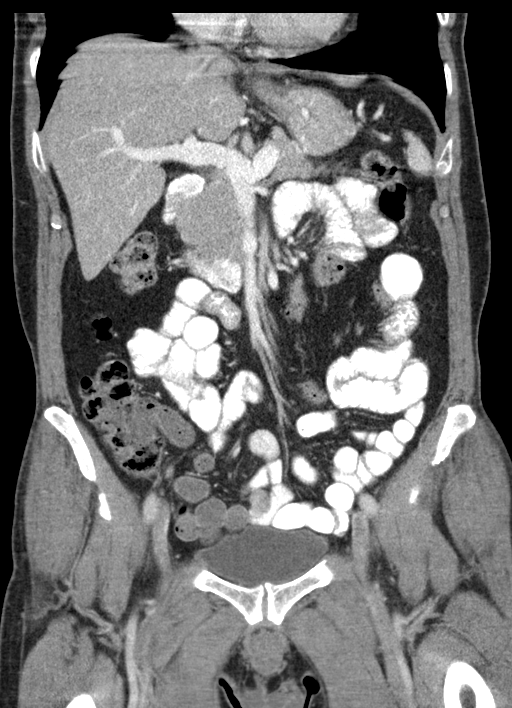
[im 46/82  soft-tissue]
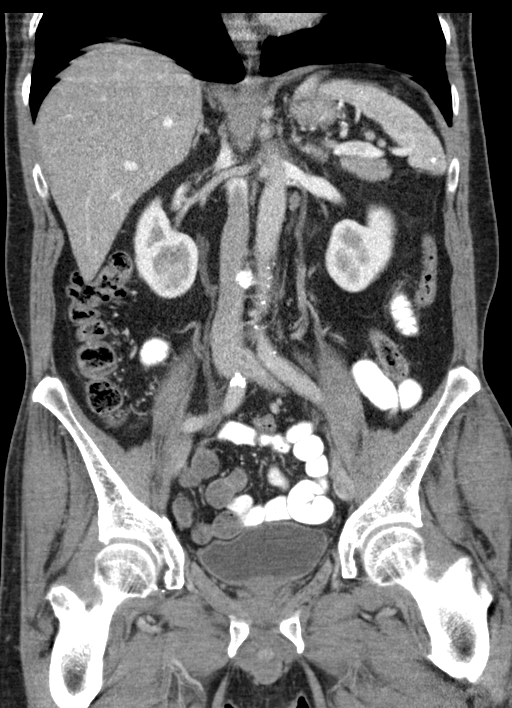

[15 of 46 positions shown; findings below may reference images not displayed]

FINDINGS: Lower chest: The lung bases are clear. Minimal dependent
atelectasis. The heart is normal in size. No pericardial effusion.
The distal esophagus is grossly normal.

Hepatobiliary: No focal hepatic lesions or intrahepatic biliary
dilatation. The gallbladder demonstrates gallbladder wall thickening
and enhancement. No pericholecystic fluid. No obvious gallstones.
Some of this could be due to partial contraction. Right upper
quadrant ultrasound suggested for further evaluation. Normal caliber
and course of the common bile duct.

Pancreas: No mass, inflammation or ductal dilatation.

Spleen: Normal size.  Splenic granulomatosis.

Adrenals/Urinary Tract: The adrenal glands and kidneys are
unremarkable. There is a small cyst associate with the left kidney.
No renal, ureteral or bladder calculi or mass.

Stomach/Bowel: The stomach, duodenum, small bowel and colon are
unremarkable. No inflammatory changes, mass lesions or obstructive
findings. The terminal ileum is normal. The appendix is normal.

Vascular/Lymphatic: Moderate scattered distal aortic calcifications
and proximal iliac artery calcifications. No aneurysm or dissection.
The branch vessels are patent. The major venous structures are
patent.

No mesenteric or retroperitoneal mass or adenopathy. Small scattered
lymph nodes are noted.

Reproductive: The prostate gland and seminal vesicles are
unremarkable.

Other: No pelvic mass or adenopathy. No free pelvic fluid
collections. No inguinal mass, adenopathy or hernia.

Musculoskeletal: No significant bony findings. Bilateral pars
defects are noted at L5.
IMPRESSION: 1. Moderate gallbladder wall thickening and enhancement. Possible
acute cholecystitis. Recommend right upper quadrant ultrasound
examination for further evaluation.
2. Diffuse calcified granulomas in the spleen and a few in the
liver.

## 2017-03-03 ENCOUNTER — Encounter: Payer: Self-pay | Admitting: *Deleted

## 2017-03-03 DIAGNOSIS — Z23 Encounter for immunization: Secondary | ICD-10-CM

## 2017-03-18 IMAGING — RF DG CHOLANGIOGRAM OPERATIVE
1 series · 4 of 4 positions shown · non-contrast
Comparison: Right upper quadrant abdominal ultrasound on 03/07/2016

CLINICAL DATA: Cholecystectomy for chronic cholecystitis and
cholelithiasis.

EXAM:
INTRAOPERATIVE CHOLANGIOGRAM
TECHNIQUE: Cholangiographic images from the C-arm fluoroscopic device were
submitted for interpretation post-operatively. Please see the
procedural report for the amount of contrast and the fluoroscopy
time utilized.

[Series 1: run · 4 of 44 frames shown]
[frame 1/44]
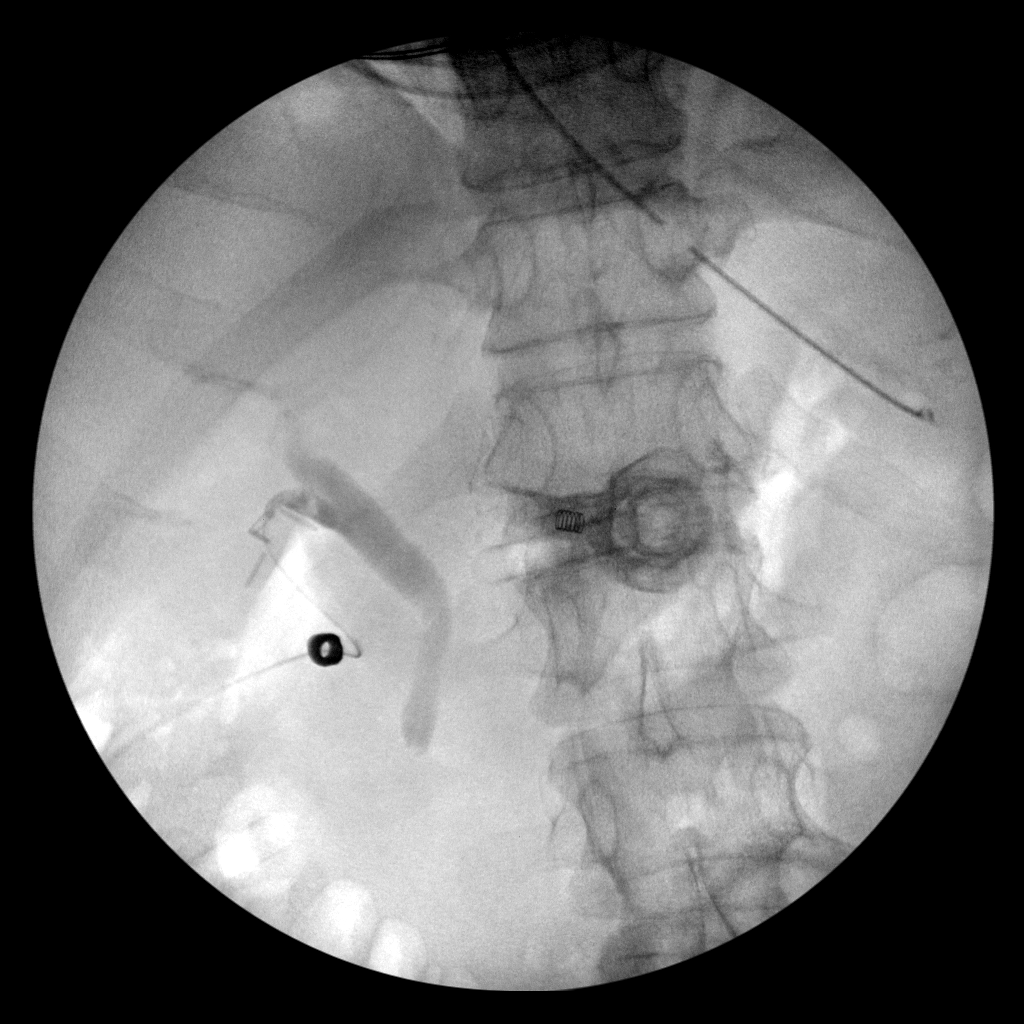
[frame 7/44]
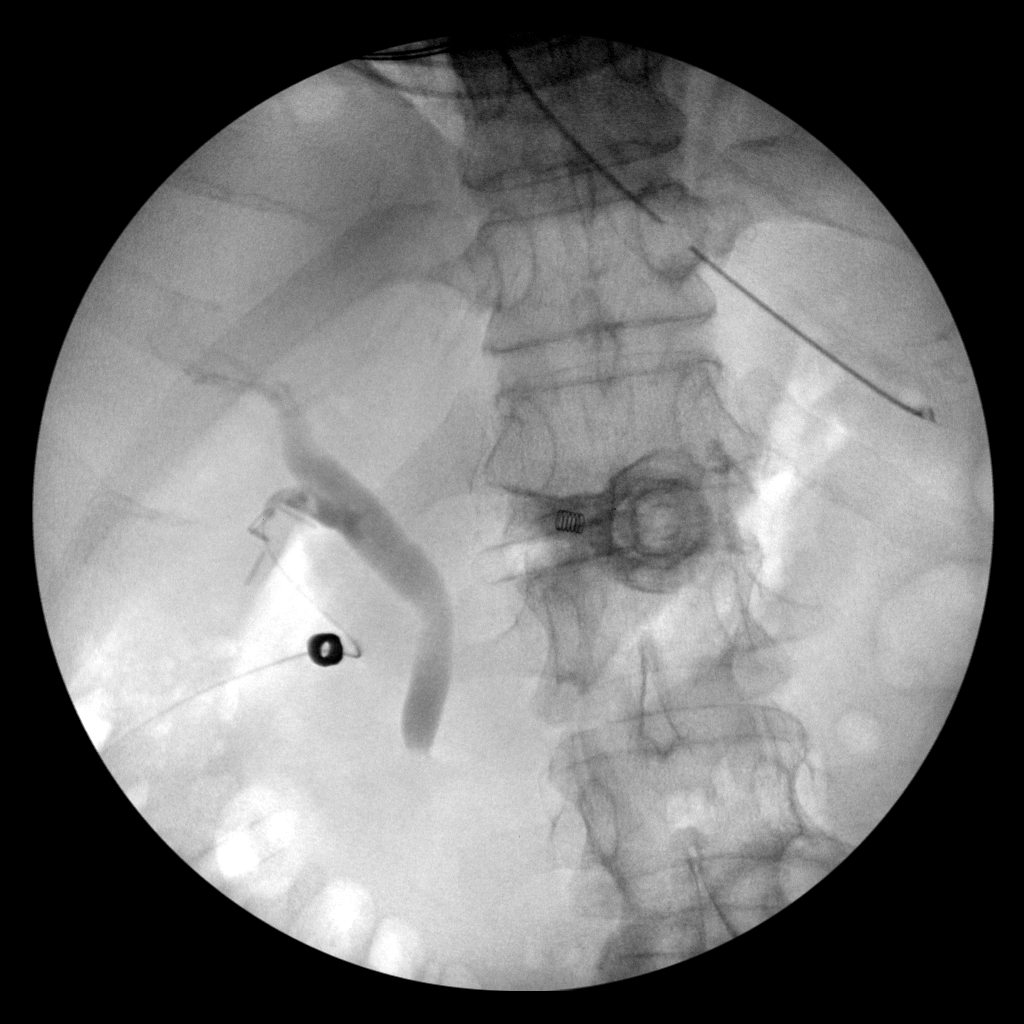
[frame 23/44]
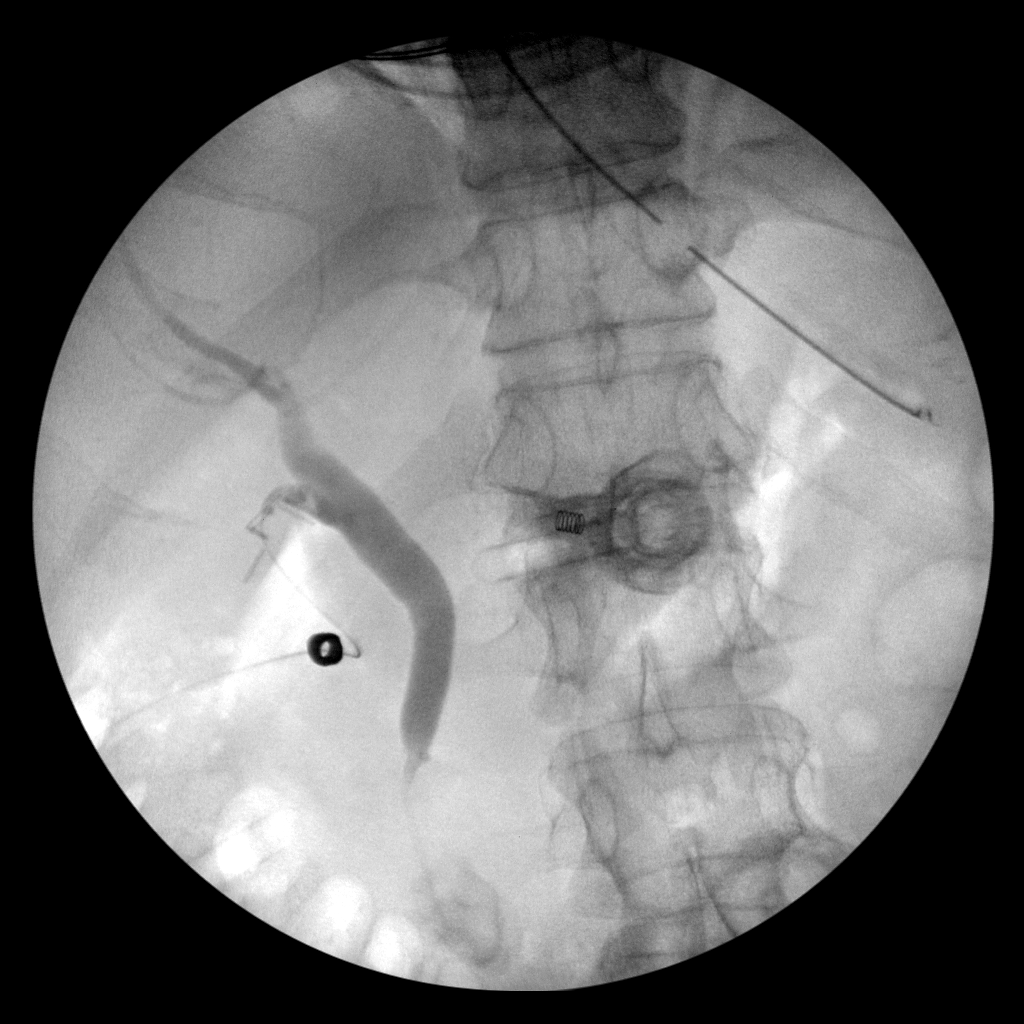
[frame 38/44]
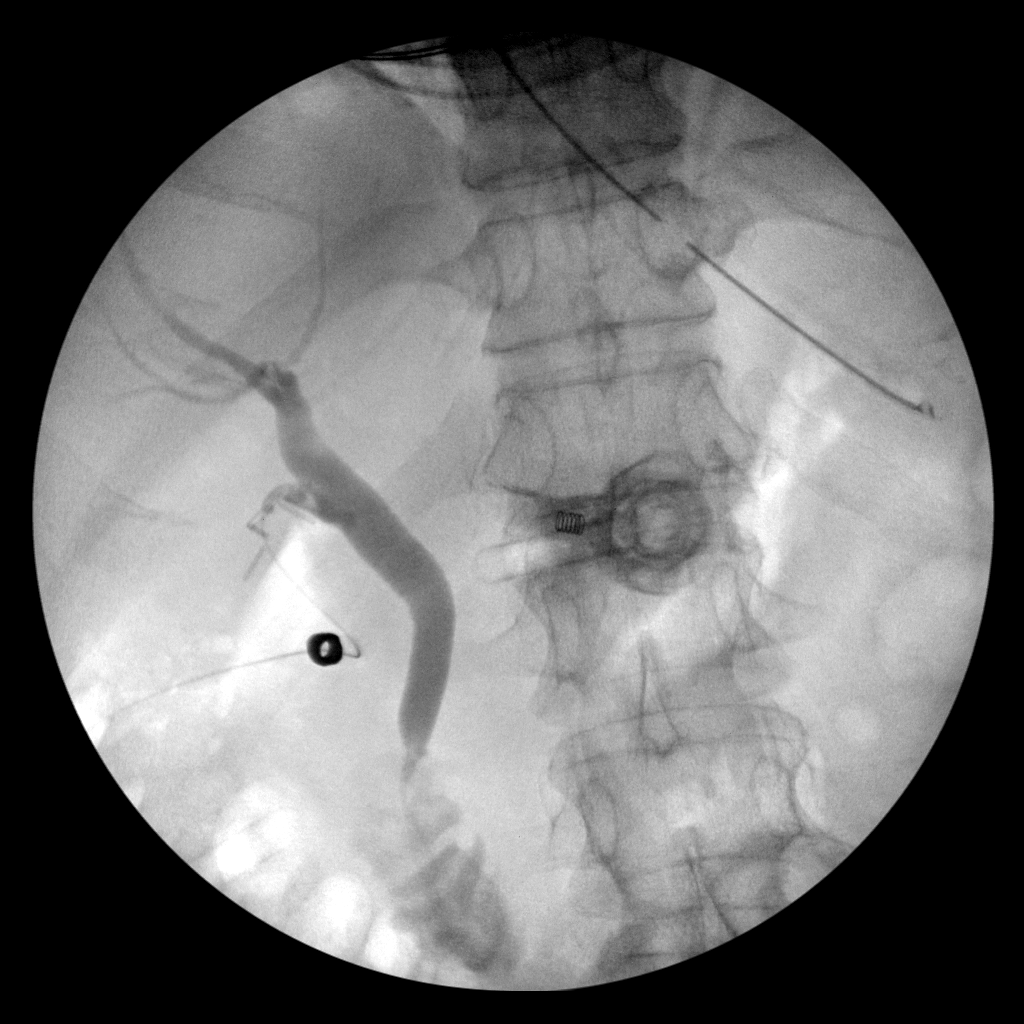

[4 of 4 positions shown; findings below may reference images not displayed]

FINDINGS: Intraoperative cholangiogram images show normal opacification of the
common bile duct without evidence of filling defect or obstruction.
Contrast enters the duodenum normally. Visualized intrahepatic ducts
are unremarkable. No contrast extravasation identified.
IMPRESSION: Unremarkable intraoperative cholangiogram.

## 2017-07-30 DIAGNOSIS — L821 Other seborrheic keratosis: Secondary | ICD-10-CM | POA: Diagnosis not present

## 2017-08-10 DIAGNOSIS — L820 Inflamed seborrheic keratosis: Secondary | ICD-10-CM | POA: Diagnosis not present

## 2017-10-01 DIAGNOSIS — Z13220 Encounter for screening for lipoid disorders: Secondary | ICD-10-CM | POA: Diagnosis not present

## 2017-10-01 DIAGNOSIS — Z1159 Encounter for screening for other viral diseases: Secondary | ICD-10-CM | POA: Diagnosis not present

## 2017-10-01 DIAGNOSIS — K635 Polyp of colon: Secondary | ICD-10-CM | POA: Diagnosis not present

## 2017-10-01 DIAGNOSIS — D229 Melanocytic nevi, unspecified: Secondary | ICD-10-CM | POA: Diagnosis not present

## 2017-10-01 DIAGNOSIS — Z125 Encounter for screening for malignant neoplasm of prostate: Secondary | ICD-10-CM | POA: Diagnosis not present

## 2017-10-01 DIAGNOSIS — Z7689 Persons encountering health services in other specified circumstances: Secondary | ICD-10-CM | POA: Diagnosis not present

## 2017-10-01 DIAGNOSIS — R7303 Prediabetes: Secondary | ICD-10-CM | POA: Diagnosis not present

## 2018-03-19 ENCOUNTER — Encounter: Payer: Self-pay | Admitting: *Deleted

## 2019-01-10 ENCOUNTER — Other Ambulatory Visit: Payer: Self-pay

## 2019-02-11 DIAGNOSIS — Z23 Encounter for immunization: Secondary | ICD-10-CM | POA: Diagnosis not present

## 2019-02-11 DIAGNOSIS — R238 Other skin changes: Secondary | ICD-10-CM | POA: Diagnosis not present

## 2019-02-11 DIAGNOSIS — Z7721 Contact with and (suspected) exposure to potentially hazardous body fluids: Secondary | ICD-10-CM | POA: Diagnosis not present

## 2019-02-11 DIAGNOSIS — R7309 Other abnormal glucose: Secondary | ICD-10-CM | POA: Diagnosis not present

## 2019-02-11 DIAGNOSIS — Z7689 Persons encountering health services in other specified circumstances: Secondary | ICD-10-CM | POA: Diagnosis not present

## 2019-02-11 DIAGNOSIS — Z1159 Encounter for screening for other viral diseases: Secondary | ICD-10-CM | POA: Diagnosis not present

## 2019-02-11 DIAGNOSIS — E789 Disorder of lipoprotein metabolism, unspecified: Secondary | ICD-10-CM | POA: Diagnosis not present

## 2019-02-17 ENCOUNTER — Encounter: Payer: Self-pay | Admitting: Internal Medicine

## 2019-03-17 ENCOUNTER — Encounter: Payer: Self-pay | Admitting: Internal Medicine

## 2019-04-15 ENCOUNTER — Other Ambulatory Visit: Payer: Self-pay

## 2019-04-15 ENCOUNTER — Ambulatory Visit (AMBULATORY_SURGERY_CENTER): Payer: Medicare Other | Admitting: *Deleted

## 2019-04-15 VITALS — Temp 96.9°F | Ht 69.0 in | Wt 181.8 lb

## 2019-04-15 DIAGNOSIS — Z1159 Encounter for screening for other viral diseases: Secondary | ICD-10-CM

## 2019-04-15 DIAGNOSIS — Z86010 Personal history of colonic polyps: Secondary | ICD-10-CM

## 2019-04-15 MED ORDER — NA SULFATE-K SULFATE-MG SULF 17.5-3.13-1.6 GM/177ML PO SOLN: 1.0000 | Freq: Once | ORAL | 0 refills | Status: AC

## 2019-04-15 NOTE — Progress Notes (Signed)
No egg or soy allergy known to patient  No issues with past sedation with any surgeries  or procedures, no intubation problems  No diet pills per patient No home 02 use per patient  No blood thinners per patient  Pt denies issues with constipation  No A fib or A flutter  EMMI video sent to pt's e mail   Due to the COVID-19 pandemic we are asking patients to follow these guidelines. Please only bring one care partner. Please be aware that your care partner may wait in the car in the parking lot or if they feel like they will be too hot to wait in the car, they may wait in the lobby on the 4th floor. All care partners are required to wear a mask the entire time (we do not have any that we can provide them), they need to practice social distancing, and we will do a Covid check for all patient's and care partners when you arrive. Also we will check their temperature and your temperature. If the care partner waits in their car they need to stay in the parking lot the entire time and we will call them on their cell phone when the patient is ready for discharge so they can bring the car to the front of the building. Also all patient's will need to wear a mask into building.  COVID SCREENING 04/24/19 3:10 P.M.  COUPON SUPREP PROVIDED

## 2019-04-18 ENCOUNTER — Telehealth: Payer: Self-pay | Admitting: Internal Medicine

## 2019-04-18 NOTE — Telephone Encounter (Signed)
Patient will come to 3rd floor to get a suprep sample kit. Lot# [id number], exp. 05/22.

## 2019-04-18 NOTE — Telephone Encounter (Signed)
Pt is scheduled for a 04/29/19 colon and stated that Suprep is over $100 and that he cannot afford it.

## 2019-04-23 ENCOUNTER — Ambulatory Visit (INDEPENDENT_AMBULATORY_CARE_PROVIDER_SITE_OTHER): Payer: Medicare Other | Admitting: Family Medicine

## 2019-04-23 ENCOUNTER — Ambulatory Visit: Payer: Self-pay

## 2019-04-23 ENCOUNTER — Encounter: Payer: Self-pay | Admitting: Family Medicine

## 2019-04-23 ENCOUNTER — Other Ambulatory Visit: Payer: Self-pay

## 2019-04-23 DIAGNOSIS — M79672 Pain in left foot: Secondary | ICD-10-CM | POA: Diagnosis not present

## 2019-04-23 MED ORDER — DICLOFENAC SODIUM 1 % EX GEL: 4.0000 g | Freq: Four times a day (QID) | CUTANEOUS | 6 refills | Status: AC | PRN

## 2019-04-23 MED ORDER — NABUMETONE 750 MG PO TABS: 750.0000 mg | ORAL_TABLET | Freq: Two times a day (BID) | ORAL | 6 refills | Status: AC | PRN

## 2019-04-23 NOTE — Patient Instructions (Signed)
   Diagnosis:  Metatarsalgia  Treatment:    - Nabumetone twice daily for 1-2 weeks - Voltaren Gel 4 times daily for 1-2 weeks - Wear shoes with good arch supports and cushioning  If pain persists, we will do additional investigation.

## 2019-04-23 NOTE — Progress Notes (Signed)
Office Visit Note   Patient: Marcus Massey           Date of Birth: May 21, 1951           MRN: [id number] Visit Date: 04/23/2019 Requested by: No referring provider defined for this encounter. PCP: Patient, No Pcp Per  Subjective: Chief Complaint  Patient presents with  . Left Foot - Pain    Pain plantar aspect of foot, around 4th metatarsal x 2 weeks, since changing to a different pair of shoes. "Feels like something is poking" when he bears weight.    HPI: He is here with left foot pain.  Symptoms started about 2 weeks ago after getting a new pair of shoes.  This pair was flat and without good cushion and he started noticing a sharp pain underneath the fourth MTP joint with weightbearing.  Pain goes away at rest.  He stopped wearing those shoes and he is feeling a little bit better, but he still cannot go all the way up onto his toes.  No previous problems with his foot.  Denies any numbness or tingling.  He is not taking anything for the pain.              ROS: No fevers or chills.  All other systems were reviewed and are negative.  Objective: Vital Signs: There were no vitals taken for this visit.  Physical Exam:  General:  Alert and oriented, in no acute distress. Pulm:  Breathing unlabored. Psy:  Normal mood, congruent affect. Skin: No rash or bruising. Left foot: There is no swelling in his foot.  No pain with flexion or extension of the toes against resistance.  I cannot reproduce his pain by palpation, but his painful area is on the plantar aspect of the fourth MTP joint.  No pain with medial/lateral squeeze of the metatarsal heads.  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  Fourth MTP is unremarkable, no sign of stress fracture.  Assessment & Plan: 1.  Left foot pain, probably fourth metatarsalgia. -Supportive shoes, Relafen as needed by mouth, Voltaren gel topically.  Consider ultrasound imaging if pain  persists and possibly cortisone injection.  Physical therapy would be another option.     Procedures: No procedures performed  No notes on file     PMFS History: Patient Active Problem List   Diagnosis Date Noted  . Abdominal fullness 01/28/2016  . Bloating 01/28/2016  . Loss of weight 01/28/2016  . Prediabetes 02/08/2015  . Gastritis and gastroduodenitis 02/08/2015  . Sternal pain 06/22/2011   Past Medical History:  Diagnosis Date  . GERD (gastroesophageal reflux disease)   . Prediabetes     Family History  Problem Relation Age of Onset  . Diabetes Brother   . Liver disease Father        [place] info  . Colon cancer Neg Hx   . Colon polyps Neg Hx   . Esophageal cancer Neg Hx   . Rectal cancer Neg Hx   . Stomach cancer Neg Hx     Past Surgical History:  Procedure Laterality Date  . CHOLECYSTECTOMY N/A 03/21/2016   Procedure: LAPAROSCOPIC CHOLECYSTECTOMY WITH INTRAOPERATIVE CHOLANGIOGRAM;  Surgeon: Alphonsa Overall, MD;  Location: [facility];  Service: General;  Laterality: N/A;  . COLONOSCOPY    . ESOPHAGOGASTRODUODENOSCOPY ENDOSCOPY    . NASAL RECONSTRUCTION WITH SEPTAL REPAIR    . POLYPECTOMY     Social History  Occupational History  . Occupation: Engineer, maintenance   Tobacco Use  . Smoking status: Former Smoker    Packs/day: 0.00    Quit date: 01/25/2015    Years since quitting: 4.2  . Smokeless tobacco: Never Used  Substance and Sexual Activity  . Alcohol use: No  . Drug use: No  . Sexual activity: Not on file

## 2019-04-24 ENCOUNTER — Other Ambulatory Visit: Payer: Self-pay | Admitting: Internal Medicine

## 2019-04-24 ENCOUNTER — Ambulatory Visit (INDEPENDENT_AMBULATORY_CARE_PROVIDER_SITE_OTHER): Payer: Medicare Other

## 2019-04-24 DIAGNOSIS — Z1159 Encounter for screening for other viral diseases: Secondary | ICD-10-CM

## 2019-04-25 LAB — SARS CORONAVIRUS 2 (TAT 6-24 HRS): SARS Coronavirus 2: NEGATIVE

## 2019-04-29 ENCOUNTER — Encounter: Payer: Self-pay | Admitting: Internal Medicine

## 2019-04-29 ENCOUNTER — Other Ambulatory Visit: Payer: Self-pay

## 2019-04-29 ENCOUNTER — Ambulatory Visit (AMBULATORY_SURGERY_CENTER): Payer: Medicare Other | Admitting: Internal Medicine

## 2019-04-29 VITALS — BP 101/64 | HR 64 | Temp 97.9°F | Resp 15 | Ht 65.0 in | Wt 181.0 lb

## 2019-04-29 DIAGNOSIS — K635 Polyp of colon: Secondary | ICD-10-CM | POA: Diagnosis not present

## 2019-04-29 DIAGNOSIS — Z86010 Personal history of colonic polyps: Secondary | ICD-10-CM

## 2019-04-29 DIAGNOSIS — Z1211 Encounter for screening for malignant neoplasm of colon: Secondary | ICD-10-CM | POA: Diagnosis not present

## 2019-04-29 DIAGNOSIS — D122 Benign neoplasm of ascending colon: Secondary | ICD-10-CM

## 2019-04-29 MED ORDER — SODIUM CHLORIDE 0.9 % IV SOLN: 500.0000 mL | Freq: Once | INTRAVENOUS | Status: DC

## 2019-04-29 NOTE — Patient Instructions (Signed)
Information on polyps, diverticulosis and hemorrhoids given to you today.  Await pathology results.  YOU HAD AN ENDOSCOPIC PROCEDURE TODAY AT THE [facility]:   Refer to the procedure report that was given to you for any specific questions about what was found during the examination.  If the procedure report does not answer your questions, please call your gastroenterologist to clarify.  If you requested that your care partner not be given the details of your procedure findings, then the procedure report has been included in a sealed envelope for you to review at your convenience later.  YOU SHOULD EXPECT: Some feelings of bloating in the abdomen. Passage of more gas than usual.  Walking can help get rid of the air that was put into your GI tract during the procedure and reduce the bloating. If you had a lower endoscopy (such as a colonoscopy or flexible sigmoidoscopy) you may notice spotting of blood in your stool or on the toilet paper. If you underwent a bowel prep for your procedure, you may not have a normal bowel movement for a few days.  Please Note:  You might notice some irritation and congestion in your nose or some drainage.  This is from the oxygen used during your procedure.  There is no need for concern and it should clear up in a day or so.  SYMPTOMS TO REPORT IMMEDIATELY:   Following lower endoscopy (colonoscopy or flexible sigmoidoscopy):  Excessive amounts of blood in the stool  Significant tenderness or worsening of abdominal pains  Swelling of the abdomen that is new, acute  Fever of 100F or higher   For urgent or emergent issues, a gastroenterologist can be reached at any hour by calling [phone].   DIET:  We do recommend a small meal at first, but then you may proceed to your regular diet.  Drink plenty of fluids but you should avoid alcoholic beverages for 24 hours.  ACTIVITY:  You should plan to take it easy for the rest of today and you should NOT  DRIVE or use heavy machinery until tomorrow (because of the sedation medicines used during the test).    FOLLOW UP: Our staff will call the number listed on your records 48-72 hours following your procedure to check on you and address any questions or concerns that you may have regarding the information given to you following your procedure. If we do not reach you, we will leave a message.  We will attempt to reach you two times.  During this call, we will ask if you have developed any symptoms of COVID 19. If you develop any symptoms (ie: fever, flu-like symptoms, shortness of breath, cough etc.) before then, please call [phone].  If you test positive for Covid 19 in the 2 weeks post procedure, please call and report this information to us.    If any biopsies were taken you will be contacted by phone or by letter within the next 1-3 weeks.  Please call us at [phone] if you have not heard about the biopsies in 3 weeks.    SIGNATURES/CONFIDENTIALITY: You and/or your care partner have signed paperwork which will be entered into your electronic medical record.  These signatures attest to the fact that that the information above on your After Visit Summary has been reviewed and is understood.  Full responsibility of the confidentiality of this discharge information lies with you and/or your care-partner. 

## 2019-04-29 NOTE — Progress Notes (Signed)
A/ox3, pleased with MAC, report to RN 

## 2019-04-29 NOTE — Op Note (Signed)
[place] Patient Name: Marcus Massey Procedure Date: 04/29/2019 10:07 AM MRN: [id number] Endoscopist: Docia Chuck. Henrene Pastor , MD Age: 68 Referring MD:  Date of Birth: 1950/09/09 Gender: Male Account #: [id number] Procedure:                Colonoscopy with cold snare polypectomy x 1 Indications:              High risk colon cancer surveillance: Personal                            history of multiple (3 or more) adenomas. Previous                            examination September 2017 Medicines:                Monitored Anesthesia Care Procedure:                Pre-Anesthesia Assessment:                           - Prior to the procedure, a History and Physical                            was performed, and patient medications and                            allergies were reviewed. The patient's tolerance of                            previous anesthesia was also reviewed. The risks                            and benefits of the procedure and the sedation                            options and risks were discussed with the patient.                            All questions were answered, and informed consent                            was obtained. Prior Anticoagulants: The patient has                            taken no previous anticoagulant or antiplatelet                            agents. ASA Grade Assessment: II - A patient with                            mild systemic disease. After reviewing the risks                            and benefits, the patient was deemed in  satisfactory condition to undergo the procedure.                           After obtaining informed consent, the colonoscope                            was passed under direct vision. Throughout the                            procedure, the patient's blood pressure, pulse, and                            oxygen saturations were monitored continuously. The   Colonoscope was introduced through the anus and                            advanced to the the cecum, identified by                            appendiceal orifice and ileocecal valve. The                            ileocecal valve, appendiceal orifice, and rectum                            were photographed. The quality of the bowel                            preparation was excellent. The colonoscopy was                            performed without difficulty. The patient tolerated                            the procedure well. The bowel preparation used was                            SUPREP via split dose instruction. Scope In: 10:13:01 AM Scope Out: 10:28:23 AM Scope Withdrawal Time: 0 hours 5 minutes 48 seconds  Total Procedure Duration: 0 hours 15 minutes 22 seconds  Findings:                 A 3 mm polyp was found in the ascending colon. The                            polyp was removed with a cold snare. Resection and                            retrieval were complete.                           A single medium-mouthed diverticulum was found in                            the cecum.  Internal hemorrhoids were found during retroflexion.                           The exam was otherwise without abnormality on                            direct and retroflexion views. Complications:            No immediate complications. Estimated blood loss:                            None. Estimated Blood Loss:     Estimated blood loss: none. Impression:               - One 3 mm polyp in the ascending colon, removed                            with a cold snare. Resected and retrieved.                           - Diverticulosis in the cecum.                           - Internal hemorrhoids.                           - The examination was otherwise normal on direct                            and retroflexion views. Recommendation:           - Repeat colonoscopy in 5 years for  surveillance                            (personal history of multiple adenomas).                           - Patient has a contact number available for                            emergencies. The signs and symptoms of potential                            delayed complications were discussed with the                            patient. Return to normal activities tomorrow.                            Written discharge instructions were provided to the                            patient.                           - Resume previous diet.                           -  Continue present medications.                           - Await pathology results. Docia Chuck. Henrene Pastor, MD 04/29/2019 10:33:37 AM This report has been signed electronically.

## 2019-04-29 NOTE — Progress Notes (Signed)
Called to room to assist during endoscopic procedure.  Patient ID and intended procedure confirmed with present staff. Received instructions for my participation in the procedure from the performing physician.  

## 2019-04-29 NOTE — Progress Notes (Signed)
Pt's states no medical or surgical changes since previsit or office visit. 

## 2019-05-01 ENCOUNTER — Telehealth: Payer: Self-pay

## 2019-05-01 NOTE — Telephone Encounter (Signed)
  Follow up Call-  Call back number 04/29/2019  Post procedure Call Back phone  # [phone]  Permission to leave phone message Yes  Some recent data might be hidden     Patient questions:  Do you have a fever, pain , or abdominal swelling? No. Pain Score  0 *  Have you tolerated food without any problems? Yes.    Have you been able to return to your normal activities? Yes.    Do you have any questions about your discharge instructions: Diet   No. Medications  No. Follow up visit  No.  Do you have questions or concerns about your Care? No.  Actions: * If pain score is 4 or above: No action needed, pain <4.  1. Have you developed a fever since your procedure? no  2.   Have you had an respiratory symptoms (SOB or cough) since your procedure? no  3.   Have you tested positive for COVID 19 since your procedure? no  4.   Have you had any family members/close contacts diagnosed with the COVID 19 since your procedure?  no   If yes to any of these questions please route to Joylene John, RN and Alphonsa Gin, Therapist, sports.

## 2019-05-02 ENCOUNTER — Encounter: Payer: Self-pay | Admitting: Internal Medicine

## 2019-05-07 ENCOUNTER — Other Ambulatory Visit: Payer: Self-pay

## 2019-08-02 ENCOUNTER — Ambulatory Visit: Payer: Medicare Other | Attending: Internal Medicine

## 2019-08-02 DIAGNOSIS — Z23 Encounter for immunization: Secondary | ICD-10-CM | POA: Insufficient documentation

## 2019-08-02 NOTE — Progress Notes (Signed)
   Covid-19 Vaccination Clinic  Name:  Marcus Massey    MRN: [id number] DOB: [birth year]  08/02/2019  [name] was observed post Covid-19 immunization for 15 minutes without incidence. He was provided with Vaccine Information Sheet and instruction to access the V-Safe system.   [name] was instructed to call 911 with any severe reactions post vaccine: Marland Kitchen Difficulty breathing  . Swelling of your face and throat  . A fast heartbeat  . A bad rash all over your body  . Dizziness and weakness    Immunizations Administered    Name Date Dose VIS Date Route   Pfizer COVID-19 Vaccine 08/02/2019  3:47 PM 0.3 mL 05/23/2019 Intramuscular   Manufacturer: [place]   Lot: [id number]   [place]: [id number]

## 2019-08-26 ENCOUNTER — Ambulatory Visit: Payer: Medicare Other | Attending: Internal Medicine

## 2019-08-26 DIAGNOSIS — Z23 Encounter for immunization: Secondary | ICD-10-CM

## 2019-08-26 NOTE — Progress Notes (Signed)
   Covid-19 Vaccination Clinic  Name:  Hristopher Hopman    MRN: [id number] DOB: 03/24/1951  08/26/2019  Mr. Pociask was observed post Covid-19 immunization for 15 minutes without incident. He was provided with Vaccine Information Sheet and instruction to access the V-Safe system.   Mr. Helming was instructed to call 911 with any severe reactions post vaccine: Marland Kitchen Difficulty breathing  . Swelling of face and throat  . A fast heartbeat  . A bad rash all over body  . Dizziness and weakness   Immunizations Administered    Name Date Dose VIS Date Route   Pfizer COVID-19 Vaccine 08/26/2019  2:26 PM 0.3 mL 05/23/2019 Intramuscular   Manufacturer: [place]   Lot: [id number]   [place]: [id number]

## 2020-03-30 ENCOUNTER — Telehealth: Payer: Self-pay | Admitting: *Deleted

## 2020-06-10 DIAGNOSIS — Z23 Encounter for immunization: Secondary | ICD-10-CM | POA: Diagnosis not present
